# Patient Record
Sex: Male | Born: 1985 | Race: White | Hispanic: No | Marital: Married | Smoking: Never smoker
Health system: Southern US, Community
[De-identification: ages and names within clinical notes are randomized; demographics above are authoritative.]

## PROBLEM LIST (undated history)

## (undated) DIAGNOSIS — L4050 Arthropathic psoriasis, unspecified: Secondary | ICD-10-CM

## (undated) DIAGNOSIS — M109 Gout, unspecified: Secondary | ICD-10-CM

## (undated) HISTORY — DX: Arthropathic psoriasis, unspecified: L40.50

## (undated) HISTORY — DX: Gout, unspecified: M10.9

---

## 2015-12-06 ENCOUNTER — Ambulatory Visit (INDEPENDENT_AMBULATORY_CARE_PROVIDER_SITE_OTHER): Payer: BC Managed Care – PPO | Admitting: Rheumatology

## 2015-12-06 ENCOUNTER — Encounter: Payer: Self-pay | Admitting: Rheumatology

## 2015-12-06 DIAGNOSIS — L4050 Arthropathic psoriasis, unspecified: Secondary | ICD-10-CM

## 2015-12-06 DIAGNOSIS — Z79899 Other long term (current) drug therapy: Secondary | ICD-10-CM | POA: Diagnosis not present

## 2015-12-06 DIAGNOSIS — L409 Psoriasis, unspecified: Secondary | ICD-10-CM | POA: Diagnosis not present

## 2015-12-06 NOTE — Progress Notes (Signed)
*IMAGE* Office Visit Note  Patient: Colin Jensen             Date of Birth: 10/16/1985           MRN: [id number]             PCP: Orvilla CornwallHairfield, Keavie C Referring: No ref. provider found Visit Date: 12/06/2015 Occupation:PE Teacher    Subjective:  Left foot pain  History of Present Illness: Colin Jensen is a 30 y.o. male . Patient reports having a flare of psoriatic arthritis about 3 weeks ago. He missed 2 doses of Enbrel which triggered this flare. The symptoms have improved now. He reports few scattered lesions of psoriasis on his back and scalp. Patient reports having a sinus infection in August which was treated with antibiotics.  Activities of Daily Living:  Patient reports morning stiffness for 0 minutes.   Patient Denies nocturnal pain.  Difficulty dressing/grooming: Denies Difficulty climbing stairs: Denies Difficulty getting out of chair: Denies Difficulty using hands for taps, buttons, cutlery, and/or writing: Denies   Review of Systems  Constitutional: Negative for fatigue and weakness.  HENT: Negative for mouth sores and mouth dryness.   Eyes: Negative for dryness.  Respiratory: Negative for shortness of breath and difficulty breathing.   Cardiovascular: Negative for chest pain and hypertension.  Gastrointestinal: Negative for constipation and diarrhea.  Musculoskeletal: Positive for arthralgias and joint pain. Negative for joint swelling, myalgias, muscle weakness, morning stiffness, muscle tenderness and myalgias.  Skin: Positive for rash (Psoriasis). Negative for color change, hair loss, nodules/bumps, skin tightness and ulcers.  Allergic/Immunologic: Negative for susceptible to infections.  Neurological: Negative for dizziness and fainting.  Hematological: Negative for swollen glands.  Psychiatric/Behavioral: Negative for depressed mood and sleep disturbance. The patient is not nervous/anxious.     PMFS History:  Patient Active Problem List   Diagnosis Date  Noted  . Psoriatic arthritis (HCC) 12/06/2015  . Psoriasis 12/06/2015  . High risk medication use 12/06/2015    Past Medical History:  Diagnosis Date  . Psoriatic arthritis (HCC)     Family History  Problem Relation Age of Onset  . Heart attack Father   . Psoriasis Father   . Diabetes Sister    History reviewed. No pertinent surgical history. Social History   Social History Narrative  . No narrative on file     Objective: Vital Signs: BP 117/66 (BP Location: Left Arm, Patient Position: Sitting, Cuff Size: Large)   Pulse 65   Resp 12   Ht 5\' 11"  (1.803 m)   Wt 278 lb (126.1 kg)   BMI 38.77 kg/m    Physical Exam  Constitutional: He appears well-developed and well-nourished.  HENT:  Head: Normocephalic and atraumatic.  Eyes: Conjunctivae and EOM are normal. Pupils are equal, round, and reactive to light.  Neck: Normal range of motion. Neck supple.  Cardiovascular: Normal rate, regular rhythm and normal heart sounds.   Pulmonary/Chest: Effort normal and breath sounds normal.  Abdominal: Soft. Bowel sounds are normal.  Musculoskeletal: Normal range of motion.  Lymphadenopathy:    He has no cervical adenopathy.  Skin: Skin is warm and dry. Capillary refill takes less than 2 seconds. Rash (Psoriatic lesions on right hand, scalp) noted.  Nail pitting on bilateral hands   Psychiatric: He has a normal mood and affect. His behavior is normal.     Musculoskeletal Exam: C-spine and thoracic, thoracic spine, lumbar spine good range of motion with no SI joint tenderness. He good range of motion of  her shoulders, elbow joints, wrist joints. He had no tenderness or synovitis over his MCP joints,Left third PIP thickened but not tender., None of the DIP joints weren't inflamed. He had good range of motion of his hip joints, knee joints, ankle joints. MTPs PIPs DIPs of his feet showed no synovitis.  CDAI Exam: CDAI Homunculus Exam:   Swelling:  Left hand: 3rd PIP  Joint Counts:   CDAI Tender Joint count: 0 CDAI Swollen Joint count: 1  Global Assessments:  Patient Global Assessment: 2 Provider Global Assessment: 2  CDAI Calculated Score: 5    Investigation: Findings:  11/16/15: CBC normal, CMP normal  06/2014; TB negative,4/10 Hep neg     Imaging: No results found.  Speciality Comments: No specialty comments available.    Procedures:  No procedures performed Allergies: Ibuprofen and Penicillins   Assessment / Plan: Visit Diagnoses:  Psoriatic arthritis : Patient had recent flare of an Brohl which was in his left ankle. He had no swelling on ankle examination today. Besides nail pitting and some psoriasis patches I do not see any active synovitis in his joints. He has chronic thickening of his left third PIP joint. As there is still planning pregnancy he will continue with the Enbrel monotherapy.  Psoriasis: He has few scattered lesions of psoriasis on his extremities scalp and back.  High risk medication use: He is on an Enbrel weekly. His medication was recently refilled. I'll check his TB gold which has not been checked since May of last year.   Obesity: His BMI is 38.77. Weight loss diet and exercise was discussed.  Prevention: Association of heart disease with psoriatic arthritis was also emphasized. Need for regular exercise, been tending blood pressure, and cholesterol was discussed.   Orders:  Face-to-face time spent with patient was 30 minutes. 50% of time was spent in counseling and coordination of care.  Follow-Up Instructions: Return in about 5 months (around 05/05/2016) for Psoriatic arthritis.   Pollyann SavoyShaili Aarika Moon, MD

## 2015-12-06 NOTE — Patient Instructions (Signed)
Standing Labs We placed an order today for your standing lab work.    Please come back and get your standing labs in January 2018 and every 3 months.  We have open lab Monday through Friday from 8:30-11:30 AM and 1-4 PM at the office of Dr. Arbutus PedShaili Nyomie Ehrlich/Naitik Panwala, PA.   The office is located at [address], [address], [place], [place] [address] No appointment is necessary.   Labs are drawn by [facility].  You may receive a bill from [facility] for your lab work.     Supplements for OA Natural anti-inflammatories  You can purchase these at [employer], [employer] or online.  . Turmeric (capsules)  . Ginger (ginger root or capsules)  . Omega 3 (Fish, flax seeds, chia seeds, walnuts, almonds)  . Tart cherry (dried or extract)   Patient should be under the care of a physician while taking these supplements. This may not be reproduced without the permission of Dr. Pollyann SavoyShaili Ailana Cuadrado.

## 2015-12-09 LAB — QUANTIFERON TB GOLD ASSAY (BLOOD)
INTERFERON GAMMA RELEASE ASSAY: NEGATIVE
Mitogen-Nil: >10 IU/mL
QUANTIFERON NIL VALUE: 0.03 [IU]/mL
QUANTIFERON TB AG MINUS NIL: 0.01 [IU]/mL

## 2015-12-11 ENCOUNTER — Telehealth: Payer: Self-pay | Admitting: Radiology

## 2015-12-11 NOTE — Telephone Encounter (Signed)
I have called patient to advise labs are normal  

## 2015-12-11 NOTE — Telephone Encounter (Signed)
-----   Message from Pollyann SavoyShaili Deveshwar, MD sent at 12/11/2015  1:51 PM EST ----- TB gold negative

## 2016-02-06 ENCOUNTER — Other Ambulatory Visit: Payer: Self-pay | Admitting: Rheumatology

## 2016-02-07 NOTE — Telephone Encounter (Signed)
Okay 

## 2016-02-07 NOTE — Telephone Encounter (Signed)
Last Visit: 12/06/15 Next Visit: 05/07/16 Labs: 11/16/15 WNL Left message  Tb Gold: 12/06/15 Neg  Okay to refill Enbrel?

## 2016-05-01 DIAGNOSIS — Z8639 Personal history of other endocrine, nutritional and metabolic disease: Secondary | ICD-10-CM | POA: Insufficient documentation

## 2016-05-01 NOTE — Progress Notes (Signed)
Office Visit Note  Patient: Colin Jensen             Date of Birth: 12/10/1985           MRN: [id number]             PCP: Wendall PapaHairfield, Keavie C Referring: Wendall PapaHairfield, Keavie C, NP Visit Date: 05/07/2016 Occupation: @GUAROCC @    Subjective:  Hand pain   History of Present Illness: Colin Jensen is a 31 y.o. male with history of psoriasis and psoriatic arthritis. He states she's been having some stiffness in his hands  by doing some yardwork as the weather has warmed up. He denies increased joint swelling. He denies any psoriasis rash. His been tolerating Enbrel well.  Activities of Daily Living:  Patient reports morning stiffness for 0 minute.   Patient Denies nocturnal pain.  Difficulty dressing/grooming: Denies Difficulty climbing stairs: Denies Difficulty getting out of chair: Denies Difficulty using hands for taps, buttons, cutlery, and/or writing: Denies   Review of Systems  Constitutional: Negative for fatigue, night sweats and weakness ( ).  HENT: Negative for mouth sores, mouth dryness and nose dryness.   Eyes: Negative for redness and dryness.  Respiratory: Negative for shortness of breath and difficulty breathing.   Cardiovascular: Negative for chest pain, palpitations, hypertension, irregular heartbeat and swelling in legs/feet.  Gastrointestinal: Negative for constipation and diarrhea.  Endocrine: Negative for increased urination.  Musculoskeletal: Positive for arthralgias and joint pain. Negative for joint swelling, myalgias, muscle weakness, morning stiffness, muscle tenderness and myalgias.  Skin: Negative for color change, rash, hair loss, nodules/bumps, skin tightness, ulcers and sensitivity to sunlight.  Allergic/Immunologic: Negative for susceptible to infections.  Neurological: Negative for dizziness, fainting, memory loss and night sweats.  Hematological: Negative for swollen glands.  Psychiatric/Behavioral: Negative for depressed mood and sleep  disturbance. The patient is not nervous/anxious.     PMFS History:  Patient Active Problem List   Diagnosis Date Noted  . History of obesity 05/01/2016  . Psoriatic arthritis (HCC) 12/06/2015  . Psoriasis 12/06/2015  . High risk medication use 12/06/2015    Past Medical History:  Diagnosis Date  . Psoriatic arthritis (HCC)     Family History  Problem Relation Age of Onset  . Heart attack Father   . Psoriasis Father   . Diabetes Sister    No past surgical history on file. Social History   Social History Narrative  . No narrative on file     Objective: Vital Signs: BP 122/70   Pulse 80   Resp 14   Ht 5\' 11"  (1.803 m)   Wt 283 lb (128.4 kg)   BMI 39.47 kg/m    Physical Exam  Constitutional: He is oriented to person, place, and time. He appears well-developed and well-nourished.  HENT:  Head: Normocephalic and atraumatic.  Eyes: Conjunctivae and EOM are normal. Pupils are equal, round, and reactive to light.  Neck: Normal range of motion. Neck supple.  Cardiovascular: Normal rate, regular rhythm and normal heart sounds.   Pulmonary/Chest: Effort normal and breath sounds normal.  Abdominal: Soft. Bowel sounds are normal.  Neurological: He is alert and oriented to person, place, and time.  Skin: Skin is warm and dry. Capillary refill takes less than 2 seconds.  Psychiatric: He has a normal mood and affect. His behavior is normal.  Nursing note and vitals reviewed.    Musculoskeletal Exam: C-spine and thoracic lumbar spine good range of motion. No SI joint tenderness. Shoulder joints although joints wrist  joint MCPs PIPs DIPs with good range of motion. Hip joints knee joints ankles MTPs PIPs DIPs with good range of motion.  CDAI Exam: CDAI Homunculus Exam:   Joint Counts:  CDAI Tender Joint count: 0 CDAI Swollen Joint count: 0  Global Assessments:  Patient Global Assessment: 3 Provider Global Assessment: 2  CDAI Calculated Score:  5    Investigation: Findings:  11/16/15: CBC normal, CMP normal  06/2014; TB negative,4/10 Hep neg  TB Gold Negative-12/2015  Office Visit on 12/06/2015  Component Date Value Ref Range Status  . Interferon Gamma Release Assay 12/06/2015 NEGATIVE  NEGATIVE Final  . Quantiferon Nil Value 12/06/2015 0.03  IU/mL Final  . Mitogen-Nil 12/06/2015 >10.00  IU/mL Final  . Quantiferon Tb Ag Minus Nil Value 12/06/2015 0.01  IU/mL Final   Comment:   The Nil tube value is used to determine if the patient has a preexisting immune response which could cause a false-positive reading on the test. In order for a test to be valid, the Nil tube must have a value of less than or equal to 8.0 IU/mL.   The mitogen control tube is used to assure the patient has a healthy immune status and also serves as a control for correct blood handling and incubation. It is used to detect false-negative readings. The mitogen tube must have a gamma interferon value of greater than or equal to 0.5 IU/mL higher than the value of the Nil tube.   The TB antigen tube is coated with the M. tuberculosis specific antigens. For a test to be considered positive, the TB antigen tube value minus the Nil tube value must be greater than or equal to 0.35 IU/mL.   For additional information, please refer to http://education.[facility].com/faq/QFT (This link is being provided for informational/educational purposes only.)       Imaging: No results found.  Speciality Comments: No specialty comments available.    Procedures:  No procedures performed Allergies: Ibuprofen and Penicillins   Assessment / Plan:     Visit Diagnoses: Psoriatic arthritis (HCC): No active synovitis on examination today.He has occasional arthralgias with activities.  Psoriasis: No active lesions were noted today  High risk medication use - Enbrel - 50mg /ML injection  INJECT ONE SURECLICK PEN (50 MG) SUBCUTANEOUSLY ONCE A WEEK. We will  check labs today and then every 3 months to monitor for drug toxicity.  History of obesity    Orders: Orders Placed This Encounter  Procedures  . CBC with Differential/Platelet  . COMPLETE METABOLIC PANEL WITH GFR  . CBC with Differential/Platelet  . COMPLETE METABOLIC PANEL WITH GFR   No orders of the defined types were placed in this encounter. Association of heart disease with psoriatic arthritis was discussed. Need to monitor blood pressure, cholesterol, and to exercise 30-60 minutes on daily basis was discussed.   Face-to-face time spent with patient was 30 minutes. 50% of time was spent in counseling and coordination of care.  Follow-Up Instructions: Return in about 5 months (around 10/07/2016) for Psoriatic arthritis.   Pollyann Savoy, MD  Note - This record has been created using Animal nutritionist.  Chart creation errors have been sought, but may not always  have been located. Such creation errors do not reflect on  the standard of medical care.

## 2016-05-07 ENCOUNTER — Ambulatory Visit (INDEPENDENT_AMBULATORY_CARE_PROVIDER_SITE_OTHER): Payer: BC Managed Care – PPO | Admitting: Rheumatology

## 2016-05-07 ENCOUNTER — Encounter: Payer: Self-pay | Admitting: Rheumatology

## 2016-05-07 VITALS — BP 122/70 | HR 80 | Resp 14 | Ht 71.0 in | Wt 283.0 lb

## 2016-05-07 DIAGNOSIS — Z79899 Other long term (current) drug therapy: Secondary | ICD-10-CM | POA: Diagnosis not present

## 2016-05-07 DIAGNOSIS — L409 Psoriasis, unspecified: Secondary | ICD-10-CM | POA: Diagnosis not present

## 2016-05-07 DIAGNOSIS — Z8639 Personal history of other endocrine, nutritional and metabolic disease: Secondary | ICD-10-CM

## 2016-05-07 DIAGNOSIS — L4050 Arthropathic psoriasis, unspecified: Secondary | ICD-10-CM

## 2016-05-07 LAB — COMPLETE METABOLIC PANEL WITH GFR
ALBUMIN: 4.2 g/dL (ref 3.6–5.1)
ALK PHOS: 46 U/L (ref 40–115)
ALT: 25 U/L (ref 9–46)
AST: 24 U/L (ref 10–40)
BUN: 14 mg/dL (ref 7–25)
CALCIUM: 9.1 mg/dL (ref 8.6–10.3)
CO2: 27 mmol/L (ref 20–31)
CREATININE: 0.86 mg/dL (ref 0.60–1.35)
Chloride: 103 mmol/L (ref 98–110)
GFR, Est African American: >89 mL/min (ref >=60)
GFR, Est Non African American: >89 mL/min (ref >=60)
Glucose, Bld: 106 mg/dL — ABNORMAL HIGH (ref 65–99)
Potassium: 4.4 mmol/L (ref 3.5–5.3)
Sodium: 141 mmol/L (ref 135–146)
TOTAL PROTEIN: 7 g/dL (ref 6.1–8.1)
Total Bilirubin: 0.9 mg/dL (ref 0.2–1.2)

## 2016-05-07 LAB — CBC WITH DIFFERENTIAL/PLATELET
BASOS PCT: 1 %
Basophils Absolute: 52 cells/uL (ref 0–200)
Eosinophils Absolute: 104 cells/uL (ref 15–500)
Eosinophils Relative: 2 %
HEMATOCRIT: 43.6 % (ref 38.5–50.0)
HEMOGLOBIN: 14.8 g/dL (ref 13.2–17.1)
LYMPHS ABS: 1768 {cells}/uL (ref 850–3900)
Lymphocytes Relative: 34 %
MCH: 31.3 pg (ref 27.0–33.0)
MCHC: 33.9 g/dL (ref 32.0–36.0)
MCV: 92.2 fL (ref 80.0–100.0)
MONO ABS: 520 {cells}/uL (ref 200–950)
MPV: 10.4 fL (ref 7.5–12.5)
Monocytes Relative: 10 %
Neutro Abs: 2756 cells/uL (ref 1500–7800)
Neutrophils Relative %: 53 %
Platelets: 182 10*3/uL (ref 140–400)
RBC: 4.73 MIL/uL (ref 4.20–5.80)
RDW: 13.3 % (ref 11.0–15.0)
WBC: 5.2 10*3/uL (ref 3.8–10.8)

## 2016-05-07 NOTE — Progress Notes (Addendum)
Rheumatology Medication Review by a Pharmacist Does the patient feel that his/her medications are working for him/her?  Yes Has the patient been experiencing any side effects to the medications prescribed?  No Does the patient have any problems obtaining medications?  No  Issues to address at subsequent visits: None   Pharmacist comments:  Colin Jensen is a pleasant 31 yo M who presents for follow up of his psoriatic arthritis.  He is currently taking Enbrel once weekly.  Most recent standing labs were 11/16/15.  Patient is due for standing labs today.  Most recent TB Gold was negative on 12/06/2015.  He will be due for TB Gold again in November 2018.  Patient denies any questions or concerns regarding his medications at this time.    Lilla Shook, Pharm.D., BCPS, CPP Clinical Pharmacist Pager: [phone] Phone: [phone] 05/07/2016 9:03 AM

## 2016-05-07 NOTE — Patient Instructions (Signed)
Standing Labs We placed an order today for your standing lab work.    Please come back and get your standing labs in July and every 3 months.  We have open lab Monday through Friday from 8:30-11:30 AM and 1:30-4 PM at the office of Dr. Nicol Herbig/Naitik Panwala, PA.   The office is located at [address], [address], [place], [place] [address] No appointment is necessary.   Labs are drawn by [facility].  You may receive a bill from [facility] for your lab work.    

## 2016-05-08 NOTE — Progress Notes (Signed)
WNL

## 2016-06-20 ENCOUNTER — Other Ambulatory Visit: Payer: Self-pay | Admitting: *Deleted

## 2016-06-20 MED ORDER — ETANERCEPT 50 MG/ML ~~LOC~~ SOAJ: SUBCUTANEOUS | 0 refills | Status: DC

## 2016-06-20 NOTE — Telephone Encounter (Signed)
Refill request received via fax  Last Visit: 05/07/16 Next Visit: 10/08/16 Labs: 05/07/16 WNL TB Gold: 12/06/15 Neg  Oka to refill per Dr. Corliss Skainseveshwar

## 2016-07-18 ENCOUNTER — Telehealth: Payer: Self-pay | Admitting: Pharmacist

## 2016-07-18 NOTE — Telephone Encounter (Signed)
Received fax from [facility] (phone number [phone]) stating they were having trouble reaching patient to process their next medication refill order.  I called patient to discuss.  There was no answer and voicemail was full.  Will attempt to call again later.    Lilla Shookachel Arlow Spiers, Pharm.D., BCPS, CPP Clinical Pharmacist Pager: [phone] Phone: [phone] 07/18/2016 4:41 PM

## 2016-07-22 ENCOUNTER — Telehealth: Payer: Self-pay | Admitting: Rheumatology

## 2016-07-22 NOTE — Telephone Encounter (Signed)
I spoke to patient regarding Enbrel.  He confirms he received a voicemail from [facility] regarding his refill, but he had not returned the call because he still has 3 pens remaining.  Patient denies any problems with medication adverse events or any concerns about medication cost.  He confirms he is taking Enbrel 50 mg weekly.  I advised patient to contact [facility] to refill his prescription when he gets down to his last pen.  Patient voiced understanding.    Lilla Shookachel Marcile Fuquay, Pharm.D., BCPS, CPP Clinical Pharmacist Pager: [phone] Phone: [phone] 07/22/2016 3:57 PM

## 2016-07-22 NOTE — Telephone Encounter (Signed)
See previous telephone note. 

## 2016-07-22 NOTE — Telephone Encounter (Signed)
Patient returned Colin Jensen's call.  CB#[phone].  Thank you.

## 2016-10-01 NOTE — Progress Notes (Signed)
Office Visit Note  Patient: Colin Jensen             Date of Birth: May 19, 1985           MRN: [id number]             PCP: Wendall Papa Referring: Wendall Papa, NP Visit Date: 10/08/2016 Occupation: @GUAROCC @    Subjective:  Psoriasis patches.   History of Present Illness: Colin Jensen is a 31 y.o. male with history of psoriatic arthritis and psoriasis. He states he is doing quite well on Enbrel once a week. He has had no side effects from the medication. He denies any joint pain or joint swelling. He has no morning stiffness. He has few psoriasis patches which were originally presented and also he has few lesions in his beard area and scalp. Patient states if he forgets is an Brohl or delays the dose then he started having joint pain and stiffness. He denies any recent episodes of Achilles tendinitis, dactylitis or plantar fasciitis. He denies any SI joint pain.  Activities of Daily Living:  Patient reports morning stiffness for 0 minute.   Patient Denies nocturnal pain.  Difficulty dressing/grooming: Denies Difficulty climbing stairs: Denies Difficulty getting out of chair: Denies Difficulty using hands for taps, buttons, cutlery, and/or writing: Denies   Review of Systems  Constitutional: Negative for fatigue, night sweats and weakness ( ).  HENT: Negative for mouth sores, mouth dryness and nose dryness.   Eyes: Negative for redness and dryness.  Respiratory: Negative for shortness of breath and difficulty breathing.   Cardiovascular: Negative for chest pain, palpitations, hypertension, irregular heartbeat and swelling in legs/feet.  Gastrointestinal: Negative for constipation and diarrhea.  Endocrine: Negative for increased urination.  Musculoskeletal: Negative for arthralgias, joint pain, joint swelling, myalgias, muscle weakness, morning stiffness, muscle tenderness and myalgias.  Skin: Positive for rash. Negative for color change, hair loss, nodules/bumps,  skin tightness, ulcers and sensitivity to sunlight.       Psoriasis patches in scalp and beard area  Allergic/Immunologic: Negative for susceptible to infections.  Neurological: Negative for dizziness, fainting, memory loss and night sweats.  Hematological: Negative for swollen glands.  Psychiatric/Behavioral: Negative for depressed mood and sleep disturbance. The patient is not nervous/anxious.     PMFS History:  Patient Active Problem List   Diagnosis Date Noted  . History of obesity 05/01/2016  . Psoriatic arthritis (HCC) 12/06/2015  . Psoriasis 12/06/2015  . High risk medication use 12/06/2015    Past Medical History:  Diagnosis Date  . Psoriatic arthritis (HCC)     Family History  Problem Relation Age of Onset  . Heart attack Father   . Psoriasis Father   . Diabetes Sister    History reviewed. No pertinent surgical history. Social History   Social History Narrative  . No narrative on file     Objective: Vital Signs: BP 130/72   Pulse 74   Resp 14   Ht 5\' 11"  (1.803 m)   Wt 270 lb (122.5 kg)   BMI 37.66 kg/m    Physical Exam  Constitutional: He is oriented to person, place, and time. He appears well-developed and well-nourished.  HENT:  Head: Normocephalic and atraumatic.  Eyes: Pupils are equal, round, and reactive to light. Conjunctivae and EOM are normal.  Neck: Normal range of motion. Neck supple.  Cardiovascular: Normal rate, regular rhythm and normal heart sounds.   Pulmonary/Chest: Effort normal and breath sounds normal.  Abdominal: Soft. Bowel sounds are  normal.  Neurological: He is alert and oriented to person, place, and time.  Skin: Skin is warm and dry. Capillary refill takes less than 2 seconds.  Dry skin and some psoriasis noted on the scalp and beard area he also has dry skin over bilateral elbows. He had nail pitting and  nail dystrophy  Psychiatric: He has a normal mood and affect. His behavior is normal.  Nursing note and vitals  reviewed.    Musculoskeletal Exam: C-spine and thoracic lumbar spine good range of motion. No SI joint tenderness. Shoulder joints elbow joints wrist joint MCPs PIPs DIPs are good range of motion with no synovitis. Hip joints knee joints ankles MTPs PIPs DIPs are good range of motion with no synovitis.  CDAI Exam: CDAI Homunculus Exam:   Joint Counts:  CDAI Tender Joint count: 0 CDAI Swollen Joint count: 0  Global Assessments:  Patient Global Assessment: 2 Provider Global Assessment: 1  CDAI Calculated Score: 3    Investigation: Findings:  12/06/2015 negative TB gold   CBC Latest Ref Rng & Units 05/07/2016  WBC 3.8 - 10.8 K/uL 5.2  Hemoglobin 13.2 - 17.1 g/dL 16.1  Hematocrit 09.6 - 50.0 % 43.6  Platelets 140 - 400 K/uL 182   CMP Latest Ref Rng & Units 05/07/2016  Glucose 65 - 99 mg/dL 045(W)  BUN 7 - 25 mg/dL 14  Creatinine 0.98 - 1.19 mg/dL 1.47  Sodium 829 - 562 mmol/L 141  Potassium 3.5 - 5.3 mmol/L 4.4  Chloride 98 - 110 mmol/L 103  CO2 20 - 31 mmol/L 27  Calcium 8.6 - 10.3 mg/dL 9.1  Total Protein 6.1 - 8.1 g/dL 7.0  Total Bilirubin 0.2 - 1.2 mg/dL 0.9  Alkaline Phos 40 - 115 U/L 46  AST 10 - 40 U/L 24  ALT 9 - 46 U/L 25    Imaging: No results found.  Speciality Comments: No specialty comments available.    Procedures:  No procedures performed Allergies: Ibuprofen and Penicillins   Assessment / Plan:     Visit Diagnoses: Psoriatic arthritis (HCC): He does not have any synovitis or joint tenderness on examination. He denies any flares of psoriatic arthritis. He states he does experience some arthralgias with delay office Enbrel injection.   Psoriasis: He still have some psoriasis patches as described above and nail pitting. I'll call in prescription for clobetasol foam to be applied to the area twice a day. Side effects were discussed.  High risk medication use - Enbrel 50 mg sq q week - Plan: CBC with Differential/Platelet, today and every 3 months to  monitor for drug toxicity COMPLETE METABOLIC PANEL WITH GFR, Quantiferon tb gold assay (blood) on a yearly basis due to long-term immunosuppressive therapy   Encounter for screening for lipid disorder - Plan: Lipid panel  Obesity (BMI 35.0-39.9 without comorbidity) : Weight loss diet and exercise was discussed at length. Patient has modified his diet to some extent. He reports  intentional weight loss.  Association of heart disease with psoriatic arthritis was discussed. Need to monitor blood pressure, cholesterol, and to exercise 30-60 minutes on daily basis was discussed.   Orders: Orders Placed This Encounter  Procedures  . Quantiferon tb gold assay (blood)  . Lipid panel   Meds ordered this encounter  Medications  . clobetasol (OLUX) 0.05 % topical foam    Sig: Apply topically 2 (two) times daily. For one month    Dispense:  100 g    Refill:  1  Follow-Up Instructions: Return in about 5 months (around 03/10/2017) for Psoriatic arthritis Ps.   Pollyann Savoy, MD  Note - This record has been created using Animal nutritionist.  Chart creation errors have been sought, but may not always  have been located. Such creation errors do not reflect on  the standard of medical care.

## 2016-10-08 ENCOUNTER — Encounter (INDEPENDENT_AMBULATORY_CARE_PROVIDER_SITE_OTHER): Payer: Self-pay

## 2016-10-08 ENCOUNTER — Encounter: Payer: Self-pay | Admitting: Rheumatology

## 2016-10-08 ENCOUNTER — Ambulatory Visit (INDEPENDENT_AMBULATORY_CARE_PROVIDER_SITE_OTHER): Payer: BC Managed Care – PPO | Admitting: Rheumatology

## 2016-10-08 VITALS — BP 130/72 | HR 74 | Resp 14 | Ht 71.0 in | Wt 270.0 lb

## 2016-10-08 DIAGNOSIS — E669 Obesity, unspecified: Secondary | ICD-10-CM | POA: Diagnosis not present

## 2016-10-08 DIAGNOSIS — L409 Psoriasis, unspecified: Secondary | ICD-10-CM | POA: Diagnosis not present

## 2016-10-08 DIAGNOSIS — Z13220 Encounter for screening for lipoid disorders: Secondary | ICD-10-CM

## 2016-10-08 DIAGNOSIS — L4050 Arthropathic psoriasis, unspecified: Secondary | ICD-10-CM

## 2016-10-08 DIAGNOSIS — Z79899 Other long term (current) drug therapy: Secondary | ICD-10-CM | POA: Diagnosis not present

## 2016-10-08 MED ORDER — CLOBETASOL PROPIONATE 0.05 % EX FOAM: Freq: Two times a day (BID) | CUTANEOUS | 1 refills | Status: DC

## 2016-10-08 NOTE — Patient Instructions (Signed)
Standing Labs We placed an order today for your standing lab work.    Please come back and get your standing labs in December and every 3 months  We have open lab Monday through Friday from 8:30-11:30 AM and 1:30-4 PM at the office of Dr. Guido Comp.   The office is located at [address], [address], [place], [place] [address] No appointment is necessary.   Labs are drawn by [facility].  You may receive a bill from [facility] for your lab work. If you have any questions regarding directions or hours of operation,  please call [phone].    

## 2016-10-09 LAB — LIPID PANEL
CHOLESTEROL: 140 mg/dL (ref <200)
HDL: 40 mg/dL — AB (ref >40)
LDL CALC: 82 mg/dL (ref <100)
TRIGLYCERIDES: 91 mg/dL (ref <150)
Total CHOL/HDL Ratio: 3.5 Ratio (ref <5.0)
VLDL: 18 mg/dL (ref <30)

## 2016-10-09 LAB — QUANTIFERON TB GOLD ASSAY (BLOOD)
Interferon Gamma Release Assay: NEGATIVE
QUANTIFERON NIL VALUE: 0.06 [IU]/mL

## 2016-10-09 NOTE — Progress Notes (Signed)
Low HDL. Advise exercise.

## 2016-10-09 NOTE — Progress Notes (Signed)
TB gold neg

## 2016-10-10 ENCOUNTER — Telehealth: Payer: Self-pay | Admitting: Rheumatology

## 2016-10-10 NOTE — Telephone Encounter (Signed)
Patient advised of lab results and verbalized understanding.  

## 2016-10-10 NOTE — Telephone Encounter (Signed)
Patient left a message at 5pm 10/09/16 stating he was returning Andrea's call.

## 2016-10-24 ENCOUNTER — Other Ambulatory Visit: Payer: Self-pay | Admitting: Rheumatology

## 2016-10-24 DIAGNOSIS — Z79899 Other long term (current) drug therapy: Secondary | ICD-10-CM

## 2016-10-24 NOTE — Telephone Encounter (Addendum)
Last Visit: 10/08/16 Next Visit: 03/10/17 Labs: 05/07/16 WNL Patient will update CBC/ CMP Friday or Monday TB Gold: 10/08/16 Neg  Left message to remind patient due for labs.  Okay to refill 30 day supply Enbrel?

## 2016-11-01 NOTE — Telephone Encounter (Signed)
ok 

## 2016-11-14 ENCOUNTER — Telehealth: Payer: Self-pay | Admitting: Rheumatology

## 2016-11-14 LAB — COMPLETE METABOLIC PANEL WITH GFR
AG RATIO: 1.6 (calc) (ref 1.0–2.5)
ALT: 18 U/L (ref 9–46)
AST: 18 U/L (ref 10–40)
Albumin: 4.6 g/dL (ref 3.6–5.1)
Alkaline phosphatase (APISO): 48 U/L (ref 40–115)
BUN: 15 mg/dL (ref 7–25)
CALCIUM: 9.3 mg/dL (ref 8.6–10.3)
CO2: 31 mmol/L (ref 20–32)
CREATININE: 0.72 mg/dL (ref 0.60–1.35)
Chloride: 104 mmol/L (ref 98–110)
GFR, EST NON AFRICAN AMERICAN: 123 mL/min/{1.73_m2} (ref >=60)
GFR, Est African American: 142 mL/min/{1.73_m2} (ref >=60)
GLOBULIN: 2.8 g/dL (ref 1.9–3.7)
Glucose, Bld: 92 mg/dL (ref 65–99)
Potassium: 4.7 mmol/L (ref 3.5–5.3)
Sodium: 140 mmol/L (ref 135–146)
TOTAL PROTEIN: 7.4 g/dL (ref 6.1–8.1)
Total Bilirubin: 0.9 mg/dL (ref 0.2–1.2)

## 2016-11-14 LAB — CBC WITH DIFFERENTIAL/PLATELET
Basophils Absolute: 62 cells/uL (ref 0–200)
Basophils Relative: 1.4 %
Eosinophils Absolute: 101 cells/uL (ref 15–500)
Eosinophils Relative: 2.3 %
HEMATOCRIT: 43 % (ref 38.5–50.0)
Hemoglobin: 14.8 g/dL (ref 13.2–17.1)
LYMPHS ABS: 1456 {cells}/uL (ref 850–3900)
MCH: 31 pg (ref 27.0–33.0)
MCHC: 34.4 g/dL (ref 32.0–36.0)
MCV: 90 fL (ref 80.0–100.0)
MPV: 10.5 fL (ref 7.5–12.5)
Monocytes Relative: 10.6 %
NEUTROS PCT: 52.6 %
Neutro Abs: 2314 cells/uL (ref 1500–7800)
Platelets: 186 10*3/uL (ref 140–400)
RBC: 4.78 10*6/uL (ref 4.20–5.80)
RDW: 12.7 % (ref 11.0–15.0)
Total Lymphocyte: 33.1 %
WBC: 4.4 10*3/uL (ref 3.8–10.8)
WBCMIX: 466 {cells}/uL (ref 200–950)

## 2016-11-14 NOTE — Telephone Encounter (Signed)
WNL

## 2016-11-14 NOTE — Telephone Encounter (Signed)
Patient called and states he had labs drawn this morning at [facility] in [place]. He wanted to let us know so he could get his prescription refilled.

## 2016-11-14 NOTE — Telephone Encounter (Signed)
Patient advised a 30 day supply was sent into the pharmacy on 11/01/16. Will send 90 supply with next refill. Patient to contact pharmacy.

## 2016-12-05 ENCOUNTER — Ambulatory Visit: Payer: Self-pay | Admitting: Rheumatology

## 2017-01-10 ENCOUNTER — Other Ambulatory Visit: Payer: Self-pay | Admitting: Rheumatology

## 2017-01-10 NOTE — Telephone Encounter (Signed)
Last Visit: 10/08/16 Next Visit: 03/10/17 Labs: 11/14/16 WNL TB Gold: 11/14/16 Neg   Okay to refill per Dr. Corliss Skainseveshwar

## 2017-03-10 ENCOUNTER — Ambulatory Visit: Payer: BC Managed Care – PPO | Admitting: Rheumatology

## 2017-03-26 NOTE — Progress Notes (Deleted)
   Office Visit Note  Patient: Colin Jensen             Date of Birth: 09/25/1985           MRN: [id number]             PCP: Wendall PapaHairfield, Keavie C Referring: Wendall PapaHairfield, Keavie C, NP Visit Date: 04/09/2017 Occupation: @GUAROCC @    Subjective:  No chief complaint on file.   History of Present Illness: Colin Jensen is a 32 y.o. male ***   Activities of Daily Living:  Patient reports morning stiffness for *** {minute/hour:19697}.   Patient {ACTIONS;DENIES/REPORTS:21021675::"Denies"} nocturnal pain.  Difficulty dressing/grooming: {ACTIONS;DENIES/REPORTS:21021675::"Denies"} Difficulty climbing stairs: {ACTIONS;DENIES/REPORTS:21021675::"Denies"} Difficulty getting out of chair: {ACTIONS;DENIES/REPORTS:21021675::"Denies"} Difficulty using hands for taps, buttons, cutlery, and/or writing: {ACTIONS;DENIES/REPORTS:21021675::"Denies"}   No Rheumatology ROS completed.   PMFS History:  Patient Active Problem List   Diagnosis Date Noted  . History of obesity 05/01/2016  . Psoriatic arthritis (HCC) 12/06/2015  . Psoriasis 12/06/2015  . High risk medication use 12/06/2015    Past Medical History:  Diagnosis Date  . Psoriatic arthritis (HCC)     Family History  Problem Relation Age of Onset  . Heart attack Father   . Psoriasis Father   . Diabetes Sister    No past surgical history on file. Social History   Social History Narrative  . Not on file     Objective: Vital Signs: There were no vitals taken for this visit.   Physical Exam   Musculoskeletal Exam: ***  CDAI Exam: No CDAI exam completed.    Investigation: No additional findings. CBC Latest Ref Rng & Units 11/14/2016 05/07/2016  WBC 3.8 - 10.8 Thousand/uL 4.4 5.2  Hemoglobin 13.2 - 17.1 g/dL 40.914.8 81.114.8  Hematocrit 91.438.5 - 50.0 % 43.0 43.6  Platelets 140 - 400 Thousand/uL 186 182   CMP Latest Ref Rng & Units 11/14/2016 05/07/2016  Glucose 65 - 99 mg/dL 92 782(N106(H)  BUN 7 - 25 mg/dL 15 14  Creatinine 5.620.60 - 1.35  mg/dL 1.300.72 8.650.86  Sodium 784135 - 146 mmol/L 140 141  Potassium 3.5 - 5.3 mmol/L 4.7 4.4  Chloride 98 - 110 mmol/L 104 103  CO2 20 - 32 mmol/L 31 27  Calcium 8.6 - 10.3 mg/dL 9.3 9.1  Total Protein 6.1 - 8.1 g/dL 7.4 7.0  Total Bilirubin 0.2 - 1.2 mg/dL 0.9 0.9  Alkaline Phos 40 - 115 U/L - 46  AST 10 - 40 U/L 18 24  ALT 9 - 46 U/L 18 25    Imaging: No results found.  Speciality Comments: No specialty comments available.    Procedures:  No procedures performed Allergies: Ibuprofen and Penicillins   Assessment / Plan:     Visit Diagnoses: Psoriatic arthritis (HCC)  Psoriasis - Clobetasol 0.05% topical foam  High risk medication use - Enbrel sureclick  History of obesity    Orders: No orders of the defined types were placed in this encounter.  No orders of the defined types were placed in this encounter.   Face-to-face time spent with patient was *** minutes. 50% of time was spent in counseling and coordination of care.  Follow-Up Instructions: No Follow-up on file.   Gearldine Bienenstockaylor M Dale, PA-C  Note - This record has been created using Dragon software.  Chart creation errors have been sought, but may not always  have been located. Such creation errors do not reflect on  the standard of medical care.

## 2017-04-09 ENCOUNTER — Ambulatory Visit: Payer: BC Managed Care – PPO | Admitting: Physician Assistant

## 2017-04-25 ENCOUNTER — Other Ambulatory Visit: Payer: Self-pay | Admitting: Rheumatology

## 2017-04-25 NOTE — Telephone Encounter (Addendum)
Last Visit: 10/08/16 Next Visit: 03/10/17 Labs: 11/14/16 WNL TB Gold: 11/14/16 Neg   Patient advised he is due to update labs. Patient will update this week.   Okay to refill 30 day supply per Dr. Corliss Skainseveshwar

## 2017-04-28 ENCOUNTER — Other Ambulatory Visit: Payer: Self-pay | Admitting: *Deleted

## 2017-04-28 DIAGNOSIS — Z79899 Other long term (current) drug therapy: Secondary | ICD-10-CM

## 2017-04-28 NOTE — Addendum Note (Signed)
Addended by: Henriette CombsHATTON, Keymani Mclean L on: 04/28/2017 09:47 AM   Modules accepted: Orders

## 2017-04-28 NOTE — Addendum Note (Signed)
Addended by: Henriette CombsHATTON, Desera Graffeo L on: 04/28/2017 09:46 AM   Modules accepted: Orders

## 2017-07-04 LAB — COMPLETE METABOLIC PANEL WITH GFR
AG Ratio: 1.6 (calc) (ref 1.0–2.5)
ALKALINE PHOSPHATASE (APISO): 58 U/L (ref 40–115)
ALT: 17 U/L (ref 9–46)
AST: 19 U/L (ref 10–40)
Albumin: 4.5 g/dL (ref 3.6–5.1)
BUN: 13 mg/dL (ref 7–25)
CALCIUM: 9.2 mg/dL (ref 8.6–10.3)
CO2: 28 mmol/L (ref 20–32)
CREATININE: 0.82 mg/dL (ref 0.60–1.35)
Chloride: 104 mmol/L (ref 98–110)
GFR, EST NON AFRICAN AMERICAN: 116 mL/min/{1.73_m2} (ref >=60)
GFR, Est African American: 135 mL/min/{1.73_m2} (ref >=60)
GLOBULIN: 2.8 g/dL (ref 1.9–3.7)
Glucose, Bld: 104 mg/dL (ref 65–139)
POTASSIUM: 4.1 mmol/L (ref 3.5–5.3)
SODIUM: 141 mmol/L (ref 135–146)
Total Bilirubin: 0.6 mg/dL (ref 0.2–1.2)
Total Protein: 7.3 g/dL (ref 6.1–8.1)

## 2017-07-04 LAB — CBC WITH DIFFERENTIAL/PLATELET
BASOS PCT: 1.1 %
Basophils Absolute: 61 cells/uL (ref 0–200)
Eosinophils Absolute: 143 cells/uL (ref 15–500)
Eosinophils Relative: 2.6 %
HCT: 41.3 % (ref 38.5–50.0)
Hemoglobin: 14.5 g/dL (ref 13.2–17.1)
Lymphs Abs: 1595 cells/uL (ref 850–3900)
MCH: 30.9 pg (ref 27.0–33.0)
MCHC: 35.1 g/dL (ref 32.0–36.0)
MCV: 87.9 fL (ref 80.0–100.0)
MONOS PCT: 10 %
MPV: 10.5 fL (ref 7.5–12.5)
Neutro Abs: 3152 cells/uL (ref 1500–7800)
Neutrophils Relative %: 57.3 %
PLATELETS: 201 10*3/uL (ref 140–400)
RBC: 4.7 10*6/uL (ref 4.20–5.80)
RDW: 12.6 % (ref 11.0–15.0)
TOTAL LYMPHOCYTE: 29 %
WBC mixed population: 550 cells/uL (ref 200–950)
WBC: 5.5 10*3/uL (ref 3.8–10.8)

## 2017-07-04 NOTE — Progress Notes (Signed)
NOTIFIED PT OF LAB RESULTS.  WOULD LIKE REFILL ON ENBREL, PLEASE ADVISE

## 2017-07-07 ENCOUNTER — Telehealth: Payer: Self-pay | Admitting: *Deleted

## 2017-07-07 NOTE — Telephone Encounter (Signed)
-----   Message from Reine JustMaria R Villareal, RT sent at 07/04/2017 10:49 AM EDT ----- PT WOULD LIKE ENBREL REFILLED, PLEASE ADVISE. NOTIFIED PT OF LAB RESULTS ----- Message ----- From: Henriette CombsHatton, Tonantzin Mimnaugh L, LPN Sent: 0/[id number]/11915/31/2019   8:20 AM To: Reine JustMaria R Villareal, RT

## 2017-07-08 ENCOUNTER — Telehealth: Payer: Self-pay | Admitting: Rheumatology

## 2017-07-08 NOTE — Telephone Encounter (Signed)
Patient left a voicemail stating he was returning your call. °

## 2017-07-08 NOTE — Telephone Encounter (Addendum)
Attempted to contact patient and unable to leave a message for patient. Mailbox is full.

## 2017-07-08 NOTE — Telephone Encounter (Signed)
Attempted to contact the patient and left message for patient to call the office. Patient needs an appointment before refill.

## 2017-07-14 ENCOUNTER — Telehealth: Payer: Self-pay | Admitting: Rheumatology

## 2017-07-14 NOTE — Telephone Encounter (Signed)
Patient advised he will need to have an appointment before prescription can be refilled. Patient has been scheduled for 07/15/17 at 9:40 am.

## 2017-07-14 NOTE — Progress Notes (Signed)
Office Visit Note  Patient: Colin Jensen             Date of Birth: August 31, 1985           MRN: [id number]             PCP: Wendall Papa Referring: Wendall Papa, NP Visit Date: 07/15/2017 Occupation: @GUAROCC @    Subjective:  Left hip pain   History of Present Illness: [name] is a 32 y.o. male with history of psoriatic arthritis.  Patient is on Enbrel 50 mg subcutaneous injections once weekly.  He has been tolerating Enbrel and has not missed any doses.  He denies any plantar fasciitis or Achilles tendinitis.  Denies any SI joint pain.  He has been having discomfort in his left hip for about 1-2 months.  He states that he was playing golf and later that evening he started having pain.  He reports he has good range of motion of his left hip joint.  He reports symptoms of left-sided sciatica.  He reports that he size PCP who ordered an abdominal CT and was worried about diverticulitis.  Apparently CT did not really reveal any abnormal findings.  No recommendations or referrals were made for the left hip pain.  He states he has been seeing a chiropractor occasionally which has been helping with the pain.  He denies any right hip or knee pain.  He denies any other joint pain or joint swelling at this time.  He states he has been trying to stay active.  He states that he his psoriasis has been stable he continues to have a few patches on his bilateral elbows bilateral knees and torso.   Activities of Daily Living:  Patient reports morning stiffness for 10-15  minutes.   Patient Denies nocturnal pain.  Difficulty dressing/grooming: Denies Difficulty climbing stairs: Denies Difficulty getting out of chair: Denies Difficulty using hands for taps, buttons, cutlery, and/or writing: Denies   Review of Systems  Constitutional: Negative for fatigue, fever and night sweats.  HENT: Negative for ear pain, mouth sores, mouth dryness and nose dryness.   Eyes: Negative for pain,  redness and dryness.  Respiratory: Negative for cough, shortness of breath and difficulty breathing.   Cardiovascular: Negative for chest pain, palpitations, hypertension, irregular heartbeat and swelling in legs/feet.  Gastrointestinal: Negative for blood in stool, constipation and diarrhea.  Endocrine: Negative for increased urination.  Genitourinary: Negative for difficulty urinating and painful urination.  Musculoskeletal: Positive for arthralgias, joint pain, muscle weakness and morning stiffness. Negative for joint swelling, myalgias, muscle tenderness and myalgias.  Skin: Negative for color change, rash, hair loss, nodules/bumps, skin tightness, ulcers and sensitivity to sunlight.  Allergic/Immunologic: Negative for susceptible to infections.  Neurological: Negative for dizziness, fainting, numbness, memory loss and night sweats.  Hematological: Negative for bruising/bleeding tendency and swollen glands.  Psychiatric/Behavioral: Negative for depressed mood and sleep disturbance. The patient is not nervous/anxious.     PMFS History:  Patient Active Problem List   Diagnosis Date Noted  . History of obesity 05/01/2016  . Psoriatic arthritis (HCC) 12/06/2015  . Psoriasis 12/06/2015  . High risk medication use 12/06/2015    Past Medical History:  Diagnosis Date  . Psoriatic arthritis (HCC)     Family History  Problem Relation Age of Onset  . Heart attack Father   . Psoriasis Father   . Diabetes Sister    History reviewed. No pertinent surgical history. Social History   Social History Narrative  .  Not on file     Objective: Vital Signs: BP 106/72 (BP Location: Left Arm, Patient Position: Sitting, Cuff Size: Normal)   Pulse 68   Ht 5\' 11"  (1.803 m)   Wt 258 lb (117 kg)   BMI 35.98 kg/m    Physical Exam  Constitutional: He is oriented to person, place, and time. He appears well-developed and well-nourished.  HENT:  Head: Normocephalic and atraumatic.  Eyes: Pupils  are equal, round, and reactive to light. Conjunctivae and EOM are normal.  Neck: Normal range of motion. Neck supple.  Cardiovascular: Normal rate, regular rhythm and normal heart sounds.  Pulmonary/Chest: Effort normal and breath sounds normal.  Abdominal: Soft. Bowel sounds are normal.  Lymphadenopathy:    He has no cervical adenopathy.  Neurological: He is alert and oriented to person, place, and time.  Skin: Skin is warm and dry. Capillary refill takes less than 2 seconds.  Psychiatric: He has a normal mood and affect. His behavior is normal.  Nursing note and vitals reviewed.    Musculoskeletal Exam: C-spine, thoracic spine, lumbar spine good range of motion.  No midline spinal tenderness.  No SI joint tenderness.  Shoulder joints, elbow joints, wrist joints, MCPs, PIPs, DIPs good range of motion with no synovitis.  He has good range of motion of bilateral hip joints.  No tenderness of trochanteric bursa.  Knee joints, ankle joints, MTPs, PIPs, DIPs good range of motion with no synovitis.  No tenderness plantar fascia or Achilles tendons bilaterally.  No warmth or effusion of bilateral knee joints.  CDAI Exam: No CDAI exam completed.    Investigation: No additional findings.  CBC Latest Ref Rng & Units 07/03/2017 11/14/2016 05/07/2016  WBC 3.8 - 10.8 Thousand/uL 5.5 4.4 5.2  Hemoglobin 13.2 - 17.1 g/dL 16.114.5 09.614.8 04.514.8  Hematocrit 38.5 - 50.0 % 41.3 43.0 43.6  Platelets 140 - 400 Thousand/uL 201 186 182   CMP Latest Ref Rng & Units 07/03/2017 11/14/2016 05/07/2016  Glucose 65 - 139 mg/dL 409104 92 811(B106(H)  BUN 7 - 25 mg/dL 13 15 14   Creatinine 0.60 - 1.35 mg/dL 1.470.82 8.290.72 5.620.86  Sodium 135 - 146 mmol/L 141 140 141  Potassium 3.5 - 5.3 mmol/L 4.1 4.7 4.4  Chloride 98 - 110 mmol/L 104 104 103  CO2 20 - 32 mmol/L 28 31 27   Calcium 8.6 - 10.3 mg/dL 9.2 9.3 9.1  Total Protein 6.1 - 8.1 g/dL 7.3 7.4 7.0  Total Bilirubin 0.2 - 1.2 mg/dL 0.6 0.9 0.9  Alkaline Phos 40 - 115 U/L - - 46  AST 10 -  40 U/L 19 18 24   ALT 9 - 46 U/L 17 18 25     Imaging: No results found.  Speciality Comments: No specialty comments available.    Procedures:  No procedures performed Allergies: Nickel; Ibuprofen; and Penicillins   Assessment / Plan:     Visit Diagnoses: Psoriatic arthritis [facility](HCC): He has no synovitis or dactylitis on exam.  He has no pain or swelling in his hands or feet.  No achilles tendonitis or plantar fasciitis.  No SI joint tenderness.  He has been having left hip pain for 1-2 months. He has good ROM on exam.  A x-ray of his left hip was obtained today. He has active psoriasis patches on torso, bilateral elbows, and bilateral knee joints.  He has been injecting Enbrel 50 mg subcutaneously once weekly. He will continue on this current treatment regimen. He was due for his last Enbrel injection on  Friday, but a refill was not sent to the pharmacy since he needed a follow up visit.  We provided a sample of Enbrel in the office today.    Psoriasis: He has active psoriasis patches on his torso, bilateral elbows, bilateral knees.  He was given a prescription for clobetasol cream which can apply topically.  High risk medication use - Enbrel 50 mg sq q week TB gold 10/08/16. CBC and CMP 07/03/17 -future order for TB gold was ordered today.  He will be due for CBC and CMP in August and every 3 months.  Plan: QuantiFERON-TB Gold Plus  Pain in left hip -he has been having increased discomfort in his left hip for the past 1 to 2 months.  He has good range of motion on exam today.  He reports left-sided sciatica intermittently.  An x-ray of his left hip was obtained today.  The x-ray of his left hip was unremarkable.  Due to his allergy to ibuprofen and NSAIDs, we will not start him on a trial of Celebrex.  He would like a referral to Dr. Alvester Morin for a left hip intraarticular cortisone injection. plan: XR HIP UNILAT W OR W/O PELVIS 2-3 VIEWS LEFT    Orders: Orders Placed This Encounter  Procedures  .  XR HIP UNILAT W OR W/O PELVIS 2-3 VIEWS LEFT  . QuantiFERON-TB Gold Plus   Meds ordered this encounter  Medications  . clobetasol cream (TEMOVATE) 0.05 %    Sig: Apply 1 application topically 2 (two) times daily.    Dispense:  30 g    Refill:  1    Face-to-face time spent with patient was 30 minutes. >50% of time was spent in counseling and coordination of care.  Follow-Up Instructions: Return in about 5 months (around 12/15/2017) for Psoriatic arthritis.   Gearldine Bienenstock, PA-C  Note - This record has been created using Dragon software.  Chart creation errors have been sought, but may not always  have been located. Such creation errors do not reflect on  the standard of medical care.

## 2017-07-14 NOTE — Telephone Encounter (Signed)
Patient called stating he is returning your call from last week.

## 2017-07-15 ENCOUNTER — Ambulatory Visit (INDEPENDENT_AMBULATORY_CARE_PROVIDER_SITE_OTHER): Payer: Self-pay

## 2017-07-15 ENCOUNTER — Ambulatory Visit: Payer: BC Managed Care – PPO | Admitting: Physician Assistant

## 2017-07-15 ENCOUNTER — Encounter: Payer: Self-pay | Admitting: Physician Assistant

## 2017-07-15 VITALS — BP 106/72 | HR 68 | Ht 71.0 in | Wt 258.0 lb

## 2017-07-15 DIAGNOSIS — M25552 Pain in left hip: Secondary | ICD-10-CM

## 2017-07-15 DIAGNOSIS — Z8639 Personal history of other endocrine, nutritional and metabolic disease: Secondary | ICD-10-CM | POA: Diagnosis not present

## 2017-07-15 DIAGNOSIS — L4050 Arthropathic psoriasis, unspecified: Secondary | ICD-10-CM

## 2017-07-15 DIAGNOSIS — Z79899 Other long term (current) drug therapy: Secondary | ICD-10-CM

## 2017-07-15 DIAGNOSIS — L409 Psoriasis, unspecified: Secondary | ICD-10-CM

## 2017-07-15 MED ORDER — ETANERCEPT 50 MG/ML ~~LOC~~ SOAJ: SUBCUTANEOUS | 0 refills | Status: DC

## 2017-07-15 MED ORDER — CLOBETASOL PROPIONATE 0.05 % EX CREA: 1.0000 "application " | TOPICAL_CREAM | Freq: Two times a day (BID) | CUTANEOUS | 1 refills | Status: DC

## 2017-07-15 NOTE — Patient Instructions (Addendum)
Standing Labs We placed an order today for your standing lab work.    Please come back and get your standing labs in August and every 3 months   TB gold, CBC, CMP   We have open lab Monday through Friday from 8:30-11:30 AM and 1:30-4:00 PM  at the office of Dr. Pollyann Savoy.   You may experience shorter wait times on Monday and Friday afternoons. The office is located at [address], [place], [place] [address] No appointment is necessary.   Labs are drawn by [facility].  You may receive a bill from [facility] for your lab work. If you have any questions regarding directions or hours of operation,  please call [phone].     Hip Exercises Ask your health care provider which exercises are safe for you. Do exercises exactly as told by your health care provider and adjust them as directed. It is normal to feel mild stretching, pulling, tightness, or discomfort as you do these exercises, but you should stop right away if you feel sudden pain or your pain gets worse.Do not begin these exercises until told by your health care provider. STRETCHING AND RANGE OF MOTION EXERCISES These exercises warm up your muscles and joints and improve the movement and flexibility of your hip. These exercises also help to relieve pain, numbness, and tingling. Exercise A: Hamstrings, Supine  1. Lie on your back. 2. Loop a belt or towel over the ball of your left / rightfoot. The ball of your foot is on the walking surface, right under your toes. 3. Straighten your left / rightknee and slowly pull on the belt to raise your leg. ? Do not let your left / right knee bend while you do this. ? Keep your other leg flat on the floor. ? Raise the left / right leg until you feel a gentle stretch behind your left / right knee or thigh. 4. Hold this position for __________ seconds. 5. Slowly return your leg to the starting position. Repeat __________ times. Complete this stretch __________ times a  day. Exercise B: Hip Rotators  1. Lie on your back on a firm surface. 2. Hold your left / right knee with your left / right hand. Hold your ankle with your other hand. 3. Gently pull your left / right knee and rotate your lower leg toward your other shoulder. ? Pull until you feel a stretch in your buttocks. ? Keep your hips and shoulders firmly planted while you do this stretch. 4. Hold this position for __________ seconds. Repeat __________ times. Complete this stretch __________ times a day. Exercise C: V-Sit (Hamstrings and Adductors)  1. Sit on the floor with your legs extended in a large "V" shape. Keep your knees straight during this exercise. 2. Start with your head and chest upright, then bend at your waist to reach for your left foot (position A). You should feel a stretch in your right inner thigh. 3. Hold this position for __________ seconds. Then slowly return to the upright position. 4. Bend at your waist to reach forward (position B). You should feel a stretch behind both of your thighs and knees. 5. Hold this position for __________ seconds. Then slowly return to the upright position. 6. Bend at your waist to reach for your right foot (position C). You should feel a stretch in your left inner thigh. 7. Hold this position for __________ seconds. Then slowly return to the upright position. Repeat __________ times. Complete this stretch __________ times  a day. Exercise D: Lunge (Hip Flexors)  1. Place your left / right knee on the floor and bend your other knee so that is directly over your ankle. You should be half-kneeling. 2. Keep good posture with your head over your shoulders. 3. Tighten your buttocks to point your tailbone downward. This helps your back to keep from arching too much. 4. You should feel a gentle stretch in the front of your left / right thigh and hip. If you do not feel any resistance, slightly slide your other foot forward and then slowly lunge forward so  your knee once again lines up over your ankle. 5. Make sure your tailbone continues to point downward. 6. Hold this position for __________ seconds. Repeat __________ times. Complete this stretch __________ times a day. STRENGTHENING EXERCISES These exercises build strength and endurance in your hip. Endurance is the ability to use your muscles for a long time, even after they get tired. Exercise E: Bridge (Hip Extensors)  1. Lie on your back on a firm surface with your knees bent and your feet flat on the floor. 2. Tighten your buttocks muscles and lift your bottom off the floor until the trunk of your body is level with your thighs. ? Do not arch your back. ? You should feel the muscles working in your buttocks and the back of your thighs. If you do not feel these muscles, slide your feet 1-2 inches (2.5-5 cm) farther away from your buttocks. 3. Hold this position for __________ seconds. 4. Slowly lower your hips to the starting position. 5. Let your muscles relax completely between repetitions. 6. If this exercise is too easy, try doing it with your arms crossed over your chest. Repeat __________ times. Complete this exercise __________ times a day. Exercise F: Straight Leg Raises - Hip Abductors  1. Lie on your side with your left / right leg in the top position. Lie so your head, shoulder, knee, and hip line up with each other. You may bend your bottom knee to help you balance. 2. Roll your hips slightly forward, so your hips are stacked directly over each other and your left / right knee is facing forward. 3. Leading with your heel, lift your top leg 4-6 inches (10-15 cm). You should feel the muscles in your outer hip lifting. ? Do not let your foot drift forward. ? Do not let your knee roll toward the ceiling. 4. Hold this position for __________ seconds. 5. Slowly return to the starting position. 6. Let your muscles relax completely between repetitions. Repeat __________ times.  Complete this exercise __________ times a day. Exercise G: Straight Leg Raises - Hip Adductors  1. Lie on your side with your left / right leg in the bottom position. Lie so your head, shoulder, knee, and hip line up. You may place your upper foot in front to help you balance. 2. Roll your hips slightly forward, so your hips are stacked directly over each other and your left / right knee is facing forward. 3. Tense the muscles in your inner thigh and lift your bottom leg 4-6 inches (10-15 cm). 4. Hold this position for __________ seconds. 5. Slowly return to the starting position. 6. Let your muscles relax completely between repetitions. Repeat __________ times. Complete this exercise __________ times a day. Exercise H: Straight Leg Raises - Quadriceps  1. Lie on your back with your left / right leg extended and your other knee bent. 2. Tense the muscles in the  front of your left / right thigh. When you do this, you should see your kneecap slide up or see increased dimpling just above your knee. 3. Tighten these muscles even more and raise your leg 4-6 inches (10-15 cm) off the floor. 4. Hold this position for __________ seconds. 5. Keep these muscles tense as you lower your leg. 6. Relax the muscles slowly and completely between repetitions. Repeat __________ times. Complete this exercise __________ times a day. Exercise I: Hip Abductors, Standing 1. Tie one end of a rubber exercise band or tubing to a secure surface, such as a table or pole. 2. Loop the other end of the band or tubing around your left / right ankle. 3. Keeping your ankle with the band or tubing directly opposite of the secured end, step away until there is tension in the tubing or band. Hold onto a chair as needed for balance. 4. Lift your left / right leg out to your side. While you do this: ? Keep your back upright. ? Keep your shoulders over your hips. ? Keep your toes pointing forward. ? Make sure to use your hip  muscles to lift your leg. Do not "throw" your leg or tip your body to lift your leg. 5. Hold this position for __________ seconds. 6. Slowly return to the starting position. Repeat __________ times. Complete this exercise __________ times a day. Exercise J: Squats (Quadriceps) 1. Stand in a door frame so your feet and knees are in line with the frame. You may place your hands on the frame for balance. 2. Slowly bend your knees and lower your hips like you are going to sit in a chair. ? Keep your lower legs in a straight-up-and-down position. ? Do not let your hips go lower than your knees. ? Do not bend your knees lower than told by your health care provider. ? If your hip pain increases, do not bend as low. 3. Hold this position for ___________ seconds. 4. Slowly push with your legs to return to standing. Do not use your hands to pull yourself to standing. Repeat __________ times. Complete this exercise __________ times a day. This information is not intended to replace advice given to you by your health care provider. Make sure you discuss any questions you have with your health care provider. Document Released: 02/08/2005 Document Revised: 10/16/2015 Document Reviewed: 01/16/2015 Elsevier Interactive Patient Education  [employer]2018 [employer] Inc.

## 2017-07-28 ENCOUNTER — Telehealth: Payer: Self-pay | Admitting: Rheumatology

## 2017-07-28 MED ORDER — DICLOFENAC SODIUM 1 % TD GEL: TRANSDERMAL | 3 refills | Status: DC

## 2017-07-28 NOTE — Telephone Encounter (Signed)
Patient calling in reference to possible flare starting in rt ankle. Patient wants to know if there is anything he can do, or rx to stop flare from progressing. [facility] [place] Please call to advise.

## 2017-07-28 NOTE — Telephone Encounter (Signed)
Patient also requested a refill on Voltaren Gel.   Last Visit: 07/15/17 Next Visit: 12/15/17  Okay to refill per Dr. Corliss Skainseveshwar  Patient just returned from vacation. Patient states that his right ankle is stiffening up. Patient states he is having pain but no swelling. Patient states he has not had an Enbrel injection in 2 weeks. Patient states his prescription is due to be delivered on  06/29/17. Please advise.

## 2017-07-28 NOTE — Telephone Encounter (Signed)
Patient called back to request Voltaren Gel which he has had in the past to [facility] [place] due to his increased pain in the right ankle.  Patient states his Enbrel will not be delivered until Wednesday.

## 2017-07-29 ENCOUNTER — Other Ambulatory Visit: Payer: Self-pay | Admitting: Physician Assistant

## 2017-07-29 ENCOUNTER — Telehealth: Payer: Self-pay | Admitting: Physician Assistant

## 2017-07-29 MED ORDER — PREDNISONE 5 MG PO TABS: ORAL_TABLET | ORAL | 0 refills | Status: DC

## 2017-07-29 NOTE — Telephone Encounter (Signed)
I called patient to discuss the right ankle pain and joint stiffness he has been experiencing.  He was recently on vacation and missed 2 doses of Enbrel.  He has been using Voltaren gel since last night, and he has noticed some improvement in the discomfort.  He continues to have significant stiffness but no joint swelling.  We discussed treatment options including a right ankle cortisone injection vs. Prednisone taper.  He requested a prednisone taper. He has been on tapers in the past and tolerated them well. I will send in a prednisone taper starting at 20 mg and tapering by 4 mg every 2 days.  He will inject his next Enbrel subcutaneous injection tomorrow.  He was advised to notify us if his ankle pain and stiffness does not improve after the prednisone taper. All questions were addressed.

## 2017-08-14 ENCOUNTER — Ambulatory Visit (INDEPENDENT_AMBULATORY_CARE_PROVIDER_SITE_OTHER): Payer: Self-pay | Admitting: Physical Medicine and Rehabilitation

## 2017-10-09 ENCOUNTER — Other Ambulatory Visit: Payer: Self-pay | Admitting: Physician Assistant

## 2017-10-09 DIAGNOSIS — Z79899 Other long term (current) drug therapy: Secondary | ICD-10-CM

## 2017-10-09 NOTE — Telephone Encounter (Signed)
Last visit: 07/15/2017 Next visit: 12/15/2017 Labs: 07/03/2017 WNL  TB Gold: 10/08/2016 negative   Advised patient he is due to update labs. Patient states he will have labs drawn at [facility] in [place]. Lab orders have been released.   Okay to refill 30 day supply, per Dr. Corliss Skains.

## 2017-11-04 ENCOUNTER — Telehealth: Payer: Self-pay | Admitting: Rheumatology

## 2017-11-04 NOTE — Telephone Encounter (Signed)
Patient called stating he is returning your call regarding his bloodwork.  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

## 2017-11-04 NOTE — Telephone Encounter (Signed)
Patient advised lab results are normal 

## 2017-11-05 LAB — COMPLETE METABOLIC PANEL WITH GFR
AG RATIO: 1.6 (calc) (ref 1.0–2.5)
ALKALINE PHOSPHATASE (APISO): 57 U/L (ref 40–115)
ALT: 15 U/L (ref 9–46)
AST: 16 U/L (ref 10–40)
Albumin: 4.6 g/dL (ref 3.6–5.1)
BILIRUBIN TOTAL: 0.7 mg/dL (ref 0.2–1.2)
BUN: 17 mg/dL (ref 7–25)
CHLORIDE: 102 mmol/L (ref 98–110)
CO2: 29 mmol/L (ref 20–32)
Calcium: 9.4 mg/dL (ref 8.6–10.3)
Creat: 0.93 mg/dL (ref 0.60–1.35)
GFR, EST NON AFRICAN AMERICAN: 107 mL/min/{1.73_m2} (ref >=60)
GFR, Est African American: 124 mL/min/{1.73_m2} (ref >=60)
GLOBULIN: 2.8 g/dL (ref 1.9–3.7)
Glucose, Bld: 93 mg/dL (ref 65–139)
POTASSIUM: 4.3 mmol/L (ref 3.5–5.3)
SODIUM: 136 mmol/L (ref 135–146)
Total Protein: 7.4 g/dL (ref 6.1–8.1)

## 2017-11-05 LAB — QUANTIFERON-TB GOLD PLUS
Mitogen-NIL: >10 IU/mL
NIL: 0.05 IU/mL
QuantiFERON-TB Gold Plus: NEGATIVE
TB1-NIL: 0 [IU]/mL
TB2-NIL: <0 IU/mL

## 2017-11-05 LAB — CBC WITH DIFFERENTIAL/PLATELET
BASOS ABS: 78 {cells}/uL (ref 0–200)
Basophils Relative: 1.1 %
Eosinophils Absolute: 71 cells/uL (ref 15–500)
Eosinophils Relative: 1 %
HEMATOCRIT: 44.7 % (ref 38.5–50.0)
Hemoglobin: 15.3 g/dL (ref 13.2–17.1)
LYMPHS ABS: 1853 {cells}/uL (ref 850–3900)
MCH: 30.5 pg (ref 27.0–33.0)
MCHC: 34.2 g/dL (ref 32.0–36.0)
MCV: 89.2 fL (ref 80.0–100.0)
MPV: 10.8 fL (ref 7.5–12.5)
Monocytes Relative: 8 %
Neutro Abs: 4530 cells/uL (ref 1500–7800)
Neutrophils Relative %: 63.8 %
PLATELETS: 219 10*3/uL (ref 140–400)
RBC: 5.01 10*6/uL (ref 4.20–5.80)
RDW: 12.7 % (ref 11.0–15.0)
TOTAL LYMPHOCYTE: 26.1 %
WBC: 7.1 10*3/uL (ref 3.8–10.8)
WBCMIX: 568 {cells}/uL (ref 200–950)

## 2017-11-05 NOTE — Telephone Encounter (Signed)
TB gold negative

## 2017-11-07 ENCOUNTER — Telehealth: Payer: Self-pay | Admitting: Pharmacy Technician

## 2017-11-07 NOTE — Telephone Encounter (Signed)
Received a Prior Authorization request from [facility] for Enbrel. Authorization has been submitted to patient's insurance via Fax. Will update once we receive a response.  Phone- [phone] Fax- [phone]  8:51 AM Dorthula Nettles, CPhT

## 2017-11-11 NOTE — Telephone Encounter (Signed)
Received a fax from [facility] regarding a prior authorization for Enbrel. Authorization has been APPROVED from 11/07/17 to 11/08/19.   Will send document to scan center.  Authorization # [id number]   8:23 AM Dorthula Nettles, CPhT

## 2017-11-24 ENCOUNTER — Other Ambulatory Visit: Payer: Self-pay | Admitting: Rheumatology

## 2017-11-24 NOTE — Telephone Encounter (Signed)
Last visit: 07/15/2017 Next visit: 12/15/2017 Labs: 11/03/17 WNL Tb gold: 11/03/17 Neg   Okay to refill per Dr. Corliss Skains

## 2017-11-25 ENCOUNTER — Telehealth: Payer: Self-pay | Admitting: Rheumatology

## 2017-11-25 MED ORDER — PREDNISONE 5 MG PO TABS: ORAL_TABLET | ORAL | 0 refills | Status: DC

## 2017-11-25 NOTE — Telephone Encounter (Signed)
Patient states he is having right ankle pain and onset of pain was on Sunday. Patient states a chiropractor friend told him it was tendonitis. Patient has psoriatic arthritis and is currently on Enbrel. Patient states he has taken enbrel on schedule and has not missed a dose. 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 Patient states he is unable to take NSAIDs or ibuprofen due to an allergy. Patient is requesting a prednisone taper. Please advise.  Last visit: 07/15/2017 Next visit: 12/15/2017

## 2017-11-25 NOTE — Telephone Encounter (Signed)
Patient called stating his right ankle is painful and is requesting a prescription of [facility] [address] in [place].

## 2017-11-25 NOTE — Telephone Encounter (Signed)
Advised patient we would call in a prednisone taper, patient requested [facility] be [facility] [place].

## 2017-11-25 NOTE — Telephone Encounter (Signed)
Okay to call in prednisone taper starting at 20 mg p.o. daily and taper by 5 mg every 4 days.

## 2017-12-10 NOTE — Progress Notes (Signed)
Office Visit Note  Patient: Colin Jensen             Date of Birth: 1986-01-22           MRN: [id number]             PCP: Wendall Papa Referring: Wendall Papa, NP Visit Date: 12/15/2017 Occupation: @GUAROCC @  Subjective:  Medication monitoring   History of Present Illness: Colin Jensen is a 32 y.o. male with history of psoriatic arthritis. He is on Enbrel 50 mg sq injections once weekly.  He has not missed any doses of Enbrel recently.  He has not had the yearly influenza vaccine.  Patient reports that over the past several months he has had several flares in the past week with days.  He is typically ice the affected joint and resolves within a few days.  He took a prednisone taper starting on 11/25/2017 for right ankle pain.  Prednisone resolved to the flare.  He states that in August he twisted his left ankle and was evaluated by an orthopedist.  According to the patient he has an old left fifth metatarsal fracture that occurred several years ago.  He states he was placed in a boot which led to increased pain and swelling in his left knee.  He had a left knee aspiration performed by Dr. Case in [place].  He has not had any recurrence of the left knee effusion.  He denies any plantar fasciitis or Achilles tendinitis.  He denies any SI joint pain.  He states he continues to have psoriasis on his scalp, torso, and right elbow.   Activities of Daily Living:  Patient reports morning stiffness for 2 minutes.   Patient Denies nocturnal pain.  Difficulty dressing/grooming: Denies Difficulty climbing stairs: Denies Difficulty getting out of chair: Denies Difficulty using hands for taps, buttons, cutlery, and/or writing: Denies  Review of Systems  Constitutional: Negative for fatigue and night sweats.  HENT: Negative for mouth sores, trouble swallowing, trouble swallowing, mouth dryness and nose dryness.   Eyes: Negative for redness, visual disturbance and dryness.  Respiratory:  Negative for cough, hemoptysis, shortness of breath and difficulty breathing.   Cardiovascular: Negative for chest pain, palpitations, hypertension, irregular heartbeat and swelling in legs/feet.  Gastrointestinal: Negative for blood in stool, constipation and diarrhea.  Endocrine: Negative for increased urination.  Genitourinary: Negative for painful urination.  Musculoskeletal: Positive for arthralgias, joint pain, joint swelling and morning stiffness. Negative for myalgias, muscle weakness, muscle tenderness and myalgias.  Skin: Positive for rash. Negative for color change, hair loss, nodules/bumps, skin tightness, ulcers and sensitivity to sunlight.  Allergic/Immunologic: Negative for susceptible to infections.  Neurological: Negative for dizziness, fainting, memory loss, night sweats and weakness.  Hematological: Negative for swollen glands.  Psychiatric/Behavioral: Negative for depressed mood and sleep disturbance. The patient is not nervous/anxious.     PMFS History:  Patient Active Problem List   Diagnosis Date Noted  . History of obesity 05/01/2016  . Psoriatic arthritis (HCC) 12/06/2015  . Psoriasis 12/06/2015  . High risk medication use 12/06/2015    Past Medical History:  Diagnosis Date  . Psoriatic arthritis (HCC)     Family History  Problem Relation Age of Onset  . Heart attack Father   . Psoriasis Father   . Diabetes Sister    History reviewed. No pertinent surgical history. Social History   Social History Narrative  . Not on file    Objective: Vital Signs: BP 110/75 (BP Location: Left  Arm, Patient Position: Sitting, Cuff Size: Large)   Pulse 74   Resp 15   Ht 5\' 11"  (1.803 m)   Wt 264 lb 12.8 oz (120.1 kg)   BMI 36.93 kg/m    Physical Exam  Constitutional: He is oriented to person, place, and time. He appears well-developed and well-nourished.  HENT:  Head: Normocephalic and atraumatic.  Eyes: Pupils are equal, round, and reactive to light.  Conjunctivae and EOM are normal.  Neck: Normal range of motion. Neck supple.  Cardiovascular: Normal rate, regular rhythm and normal heart sounds.  Pulmonary/Chest: Effort normal and breath sounds normal.  Abdominal: Soft. Bowel sounds are normal.  Lymphadenopathy:    He has no cervical adenopathy.  Neurological: He is alert and oriented to person, place, and time.  Skin: Skin is warm and dry. Capillary refill takes less than 2 seconds.  Nail pitting in all fingernails.  Psoriasis on scalp, small patch on extensor surface of right elbow, and torso  Psychiatric: He has a normal mood and affect. His behavior is normal.  Nursing note and vitals reviewed.    Musculoskeletal Exam: C-spine, thoracic spine, and lumbar spine good ROM.  No midline spinal tenderness.  No SI joint tenderness.  Shoulder joints, elbow joints, wrist joints, MCPs, PIPs, and DIPs good ROM with no synovitis.  Hip joints, knee joints, ankle joints, MTPs, PIPs, and DIPs good ROM with no synovitis.  No warmth or effusion of knee joints. No tenderness or swelling of ankle joints.  No plantar fasciitis or achilles tendonitis.    CDAI Exam: CDAI Score: Not documented Patient Global Assessment: Not documented; Provider Global Assessment: Not documented Swollen: Not documented; Tender: Not documented Joint Exam   Not documented   There is currently no information documented on the homunculus. Go to the Rheumatology activity and complete the homunculus joint exam.  Investigation: No additional findings.  Imaging: No results found.  Recent Labs: Lab Results  Component Value Date   WBC 7.1 11/03/2017   HGB 15.3 11/03/2017   PLT 219 11/03/2017   NA 136 11/03/2017   K 4.3 11/03/2017   CL 102 11/03/2017   CO2 29 11/03/2017   GLUCOSE 93 11/03/2017   BUN 17 11/03/2017   CREATININE 0.93 11/03/2017   BILITOT 0.7 11/03/2017   ALKPHOS 46 05/07/2016   AST 16 11/03/2017   ALT 15 11/03/2017   PROT 7.4 11/03/2017   ALBUMIN  4.2 05/07/2016   CALCIUM 9.4 11/03/2017   GFRAA 124 11/03/2017   QFTBGOLDPLUS NEGATIVE 11/03/2017    Speciality Comments: No specialty comments available.  Procedures:  No procedures performed Allergies: Nickel; Ibuprofen; and Penicillins   Assessment / Plan:     Visit Diagnoses: Psoriatic arthritis (HCC) -He has no synovitis or dactylitis on exam today.  He has been having intermittent flares over the past several months. He has not missed any doses of Enbrel recently. He continues to have active psoriasis.  He has no achilles tendonitis or plantar fasciitis.  He has no SI joint tenderness.  We discussed switching from Enbrel to Cosentyx.  Indications, contraindications, and potential side effects of cosentyx were discussed.  He will notify us when he would like to switch.  He recently received a refill of Enbrel, and he does not want to switch at this time.  He was given a handout of information about Cosentyx.  X-rays of both hands and feet were obtained today to assess for radiographic progression.  He was strongly encouraged to receive the yearly  influenza vaccine.  He will follow up in the office in 5 months.  He will notify us if he develops increased joint pain or joint swelling. Plan: XR Hand 2 View Right, XR Hand 2 View Left, XR Foot 2 Views Left, XR Foot 2 Views Right  Psoriasis: He continues to have scalp psoriasis and patches on his torso.  He has a new patch of psoriasis on the extensor surface of the right elbow joint.  He has nail pitting in all fingernails.   High risk medication use -Current regimen includes Enbrel 50 mg subq weekly .  He was given a prednisone taper on 11/25/17, which he completed.  Most recent TB gold negative on 11/03/17.  Last CBC/CMP within normal limits on 11/03/17.  Next labs due end of December and standing order placed today.  Plan: CBC with Differential/Platelet, COMPLETE METABOLIC PANEL WITH GFR  Pain in left hip - XR unremarkable. He has no discomfort  at this time.  He has good ROM on exam.   Orders: Orders Placed This Encounter  Procedures  . XR Hand 2 View Right  . XR Hand 2 View Left  . XR Foot 2 Views Left  . XR Foot 2 Views Right  . CBC with Differential/Platelet  . COMPLETE METABOLIC PANEL WITH GFR   No orders of the defined types were placed in this encounter.   Face-to-face time spent with patient was . Greater than 50% of time was spent in counseling and coordination of care.  Follow-Up Instructions: Return in about 5 months (around 05/16/2018) for Psoriatic arthritis.   Gearldine Bienenstock, PA-C  Note - This record has been created using Dragon software.  Chart creation errors have been sought, but may not always  have been located. Such creation errors do not reflect on  the standard of medical care.

## 2017-12-15 ENCOUNTER — Ambulatory Visit (INDEPENDENT_AMBULATORY_CARE_PROVIDER_SITE_OTHER): Payer: Self-pay

## 2017-12-15 ENCOUNTER — Encounter: Payer: Self-pay | Admitting: Physician Assistant

## 2017-12-15 ENCOUNTER — Ambulatory Visit: Payer: BC Managed Care – PPO | Admitting: Physician Assistant

## 2017-12-15 VITALS — BP 110/75 | HR 74 | Resp 15 | Ht 71.0 in | Wt 264.8 lb

## 2017-12-15 DIAGNOSIS — Z79899 Other long term (current) drug therapy: Secondary | ICD-10-CM | POA: Diagnosis not present

## 2017-12-15 DIAGNOSIS — M25571 Pain in right ankle and joints of right foot: Secondary | ICD-10-CM

## 2017-12-15 DIAGNOSIS — L4050 Arthropathic psoriasis, unspecified: Secondary | ICD-10-CM

## 2017-12-15 DIAGNOSIS — M25552 Pain in left hip: Secondary | ICD-10-CM | POA: Diagnosis not present

## 2017-12-15 DIAGNOSIS — M25572 Pain in left ankle and joints of left foot: Secondary | ICD-10-CM

## 2017-12-15 DIAGNOSIS — L409 Psoriasis, unspecified: Secondary | ICD-10-CM

## 2017-12-15 DIAGNOSIS — M25541 Pain in joints of right hand: Secondary | ICD-10-CM

## 2017-12-15 DIAGNOSIS — M25542 Pain in joints of left hand: Secondary | ICD-10-CM | POA: Diagnosis not present

## 2017-12-15 NOTE — Patient Instructions (Addendum)
Standing Labs We placed an order today for your standing lab work.    Please come back and get your standing labs in December and every 3 months   We have open lab Monday through Friday from 8:30-11:30 AM and 1:30-4:00 PM  at the office of Dr. Pollyann Savoy.   You may experience shorter wait times on Monday and Friday afternoons. The office is located at [address], [address], [place], [place] [address] No appointment is necessary.   Labs are drawn by [facility].  You may receive a bill from [facility] for your lab work. If you have any questions regarding directions or hours of operation,  please call [phone].   Just as a reminder please drink plenty of water prior to coming for your lab work. Thanks!   Secukinumab injection What is this medicine? SECUKINUMAB (sek ue KIN ue mab) is used to treat psoriasis. It is also used to treat psoriatic arthritis and ankylosing spondylitis. This medicine may be used for other purposes; ask your health care provider or pharmacist if you have questions. COMMON BRAND NAME(S): Cosentyx What should I tell my health care provider before I take this medicine? They need to know if you have any of these conditions: -Crohn's disease, ulcerative colitis, or other inflammatory bowel disease -infection or history of infection -other conditions affecting the immune system -recently received or are scheduled to receive a vaccine -tuberculosis, a positive skin test for tuberculosis, or have recently been in close contact with someone who has tuberculosis -an unusual or allergic reaction to secukinumab, other medicines, latex, rubber, foods, dyes, or preservatives -pregnant or trying to get pregnant -breast-feeding How should I use this medicine? This medicine is for injection under the skin. It may be administered by a healthcare professional in a hospital or clinic setting or at home. If you get this medicine at home, you will be taught how to prepare and  give this medicine. Use exactly as directed. Take your medicine at regular intervals. Do not take your medicine more often than directed. It is important that you put your used needles and syringes in a special sharps container. Do not put them in a trash can. If you do not have a sharps container, call your pharmacist or healthcare provider to get one. A special MedGuide will be given to you by the pharmacist with each prescription and refill. Be sure to read this information carefully each time. Talk to your pediatrician regarding the use of this medicine in children. Special care may be needed. Overdosage: If you think you have taken too much of this medicine contact a poison control center or emergency room at once. NOTE: This medicine is only for you. Do not share this medicine with others. What if I miss a dose? It is important not to miss your dose. Call your doctor of health care professional if you are unable to keep an appointment. If you give yourself the medicine and you miss a dose, take it as soon as you can. If it is almost time for your next dose, take only that dose. Do not take double or extra doses. What may interact with this medicine? Do not take this medicine with any of the following medications: -live virus vaccines This medicine may also interact with the following medications: -cyclosporine -inactivated vaccines -warfarin This list may not describe all possible interactions. Give your health care provider a list of all the medicines, herbs, non-prescription drugs, or dietary supplements you use. Also tell them  if you smoke, drink alcohol, or use illegal drugs. Some items may interact with your medicine. What should I watch for while using this medicine? Tell your doctor or healthcare professional if your symptoms do not start to get better or if they get worse. You will be tested for tuberculosis (TB) before you start this medicine. If your doctor prescribes any medicine  for TB, you should start taking the TB medicine before starting this medicine. Make sure to finish the full course of TB medicine. Call your doctor or healthcare professional for advice if you get a fever, chills or sore throat, or other symptoms of a cold or flu. Do not treat yourself. This drug decreases your body's ability to fight infections. Try to avoid being around people who are sick. This medicine can decrease the response to a vaccine. If you need to get vaccinated, tell your healthcare professional if you have received this medicine within the last 6 months. Extra booster doses may be needed. Talk to your doctor to see if a different vaccination schedule is needed. What side effects may I notice from receiving this medicine? Side effects that you should report to your doctor or health care professional as soon as possible: -allergic reactions like skin rash, itching or hives, swelling of the face, lips, or tongue -signs and symptoms of infection like fever or chills; cough; sore throat; pain or trouble passing urine Side effects that usually do not require medical attention (report to your doctor or health care professional if they continue or are bothersome): -diarrhea This list may not describe all possible side effects. Call your doctor for medical advice about side effects. You may report side effects to FDA at 1-800-FDA-1088. Where should I keep my medicine? Keep out of the reach of children. Store the prefilled syringe or injection pen in a refrigerator between 2 to 8 degrees C (36 to 46 degrees F). Keep the syringe or the pen in the original carton until ready for use. Protect from light. Do not freeze. Do not shake. Prior to use, remove the syringe or pen from the refrigerator and use within 1 hour. Throw away any unused medicine after the expiration date on the label. NOTE: This sheet is a summary. It may not cover all possible information. If you have questions about this medicine,  talk to your doctor, pharmacist, or health care provider.  2018 Elsevier/Gold Standard (2015-02-23 11:48:31)

## 2017-12-26 ENCOUNTER — Telehealth: Payer: Self-pay | Admitting: Pharmacy Technician

## 2017-12-26 NOTE — Telephone Encounter (Signed)
Called patient to follow up if he had decided if he wanted to switch from Enbrel to Cosentyx. Per last OV note, patient discussed but did not decide.  Left message for patient to call back. Per patient's dad, patient has been waiting to hear from us.  11:58 AM Dorthula Nettlesachael N Iziah Cates, CPhT

## 2018-02-11 ENCOUNTER — Other Ambulatory Visit: Payer: Self-pay | Admitting: Rheumatology

## 2018-02-11 NOTE — Telephone Encounter (Addendum)
Last Visit: 12/15/17 Next visit: 05/14/18 Labs: 11/03/17 WNL TB Gold: 11/03/17 Neg   Left message to advise patient he is due to update labs.   Okay to refill 30 day supply per Dr. Corliss Skains

## 2018-02-12 ENCOUNTER — Telehealth: Payer: Self-pay | Admitting: Rheumatology

## 2018-02-12 DIAGNOSIS — Z79899 Other long term (current) drug therapy: Secondary | ICD-10-CM

## 2018-02-12 NOTE — Telephone Encounter (Signed)
Patient left message at 1:26pm [facility]. Patient stated he had an appt for this pm. Wanted you to release orders for labs. Please call to discuss.

## 2018-02-12 NOTE — Telephone Encounter (Signed)
Patient advised lab orders have been released.  

## 2018-02-14 LAB — CBC WITH DIFFERENTIAL/PLATELET
Absolute Monocytes: 429 cells/uL (ref 200–950)
BASOS PCT: 1.5 %
Basophils Absolute: 80 cells/uL (ref 0–200)
EOS PCT: 3.2 %
Eosinophils Absolute: 170 cells/uL (ref 15–500)
HCT: 42.5 % (ref 38.5–50.0)
HEMOGLOBIN: 15 g/dL (ref 13.2–17.1)
Lymphs Abs: 1537 cells/uL (ref 850–3900)
MCH: 31.4 pg (ref 27.0–33.0)
MCHC: 35.3 g/dL (ref 32.0–36.0)
MCV: 89.1 fL (ref 80.0–100.0)
MONOS PCT: 8.1 %
MPV: 11 fL (ref 7.5–12.5)
NEUTROS ABS: 3085 {cells}/uL (ref 1500–7800)
Neutrophils Relative %: 58.2 %
Platelets: 217 10*3/uL (ref 140–400)
RBC: 4.77 10*6/uL (ref 4.20–5.80)
RDW: 12.5 % (ref 11.0–15.0)
Total Lymphocyte: 29 %
WBC: 5.3 10*3/uL (ref 3.8–10.8)

## 2018-02-14 LAB — COMPLETE METABOLIC PANEL WITH GFR
AG RATIO: 1.7 (calc) (ref 1.0–2.5)
ALKALINE PHOSPHATASE (APISO): 50 U/L (ref 40–115)
ALT: 31 U/L (ref 9–46)
AST: 22 U/L (ref 10–40)
Albumin: 4.5 g/dL (ref 3.6–5.1)
BUN: 13 mg/dL (ref 7–25)
CO2: 32 mmol/L (ref 20–32)
Calcium: 9.1 mg/dL (ref 8.6–10.3)
Chloride: 102 mmol/L (ref 98–110)
Creat: 0.84 mg/dL (ref 0.60–1.35)
GFR, Est African American: 132 mL/min/{1.73_m2} (ref >=60)
GFR, Est Non African American: 114 mL/min/{1.73_m2} (ref >=60)
Globulin: 2.7 g/dL (calc) (ref 1.9–3.7)
Glucose, Bld: 141 mg/dL — ABNORMAL HIGH (ref 65–139)
POTASSIUM: 4.2 mmol/L (ref 3.5–5.3)
Sodium: 140 mmol/L (ref 135–146)
Total Bilirubin: 0.6 mg/dL (ref 0.2–1.2)
Total Protein: 7.2 g/dL (ref 6.1–8.1)

## 2018-02-16 NOTE — Telephone Encounter (Signed)
Glucose is 141.  All other labs are WNL.

## 2018-04-27 ENCOUNTER — Other Ambulatory Visit: Payer: Self-pay | Admitting: Rheumatology

## 2018-04-27 NOTE — Telephone Encounter (Signed)
Last Visit: 12/15/2017 Next Visit: 05/14/2018 Labs: 02/13/2018 Glucose is 141. All other labs are WNL.  TB Gold: 11/03/2017 negative   Okay to refill per Dr. Corliss Skains.

## 2018-05-07 NOTE — Progress Notes (Signed)
Virtual Visit via Video Note  I connected with Colin Jensen on 05/07/18 at  8:00 AM EDT by a video enabled telemedicine application and verified that I am speaking with the correct person using two identifiers.   I discussed the limitations of evaluation and management by telemedicine and the availability of in person appointments. The patient expressed understanding and agreed to proceed.  CC: Medication monitoring   History of Present Illness: Patient is a 33 year old male with a past medical history of psoriatic arthritis.  He is on Enbrel 50 mg sq weekly injections.  He has not missed any doses.  He has not had any recent infections.  He is tolerating it well without any side effects.  He denies any recent flares.  He has no joint pain or joint swelling at this time.  He has no achilles tendonitis or plantar fasciitis.  He has no SI joint pain. If he experiences any joint discomfort he uses voltaren gel topically but he has not needed to recently.  He has no morning stiffness.  He has been working on out on a regular basis. He has a few scattered patches of psoriasis on his abdomen and scalp.  He uses clobetasol foam on his scalp and cream on his abdomen.      Review of Systems  Constitutional: Negative for fever and malaise/fatigue.  Eyes: Negative for photophobia, pain, discharge and redness.  Respiratory: Negative for cough, shortness of breath and wheezing.   Cardiovascular: Negative for chest pain and palpitations.  Gastrointestinal: Negative for blood in stool, constipation and diarrhea.  Genitourinary: Negative for dysuria.  Musculoskeletal: Negative for back pain, joint pain, myalgias and neck pain.  Skin: Positive for rash (Psoriasis).  Neurological: Negative for dizziness and headaches.  Psychiatric/Behavioral: Negative for depression. The patient is not nervous/anxious and does not have insomnia.    Observations/Objective:  Physical Exam  Constitutional: He is oriented to  person, place, and time and well-developed, well-nourished, and in no distress.  HENT:  Head: Normocephalic and atraumatic.  Eyes: Conjunctivae are normal.  Pulmonary/Chest: Effort normal.  Neurological: He is alert and oriented to person, place, and time.  Psychiatric: Mood, memory, affect and judgment normal.   Complete fist formation bilaterally.   Patient reports morning stiffness for0  minutes.   Patient denies nocturnal pain.  Difficulty dressing/grooming: Denies Difficulty climbing stairs: Denies Difficulty getting out of chair: Denies Difficulty using hands for taps, buttons, cutlery, and/or writing: Denies   He has no new allergies.  No new medications.   No new medical diagnoses.   Assessment and Plan: Visit Diagnoses: Psoriatic arthritis (HCC) -He has not had any recent flares.  He has no joint pain or joint swelling at this time.  He does not experience any morning stiffness. He has no achilles tendonitis or plantar fasciitis.  He has no SI joint pain.  He is clinically doing well on Enbrel 50 mg sq injections once weekly.  He has no missed any doses recently.  He has not had any recent infections.  He has not experienced any side effects and is tolerating Enbrel.  We discussed switching him to Cosentyx at his last visit, and he is still interested in switching once things calm down with COVID-19.  He continues to have scattered patches of psoriasis on his abdomen and scalp, so he would like to try Cosentyx to see if it will help with skin clearance.  He has been using clobetasol topically PRN.  He is also  interested in switching due to possibly less long term side effects with cosentyx compared to Enbrel.  Enbrel has been effective for controlling his arthritis.  At his next visit in 3 months, we will discuss switching him to Cosentyx.  For now he will remain on Enbrel 50 mg sq injections weekly.  He does not need any refills at this time.  He was advised to notify us if he  develops increased joint pain or joint swelling.  He will follow up in 3 months.   Psoriasis: He has scattered patches of psoriasis on his abdomen and scalp.  He uses clobetasol foam on his scalp and cream on his abdomen.  He does not need any refills at this time.  In the future he is interested in switching to cosentyx to help with psoriasis skin clearance.   High risk medication use -Current regimen includes Enbrel 50 mg subq weekly.  TB gold negative on 11/03/17.  CBC and CMP were drawn on 02/13/18.  He is due for lab work this month.  Future orders for CBC and CMP were placed today.  He was advised to hold his dose of Enbrel anytime he has an infection.  He was encouraged to practice good hand hygiene and follow the recommendations made by the CDC in regards to COVID-19.    Follow Up Instructions: He will follow up in 3 months.    I discussed the assessment and treatment plan with the patient. The patient was provided an opportunity to ask questions and all were answered. The patient agreed with the plan and demonstrated an understanding of the instructions.   The patient was advised to call back or seek an in-person evaluation if the symptoms worsen or if the condition fails to improve as anticipated.  I provided 15 minutes of non-face-to-face time during this encounter.   Sherron Ales, PA-C

## 2018-05-14 ENCOUNTER — Other Ambulatory Visit: Payer: Self-pay

## 2018-05-14 ENCOUNTER — Telehealth: Payer: Self-pay | Admitting: Rheumatology

## 2018-05-14 ENCOUNTER — Telehealth (INDEPENDENT_AMBULATORY_CARE_PROVIDER_SITE_OTHER): Payer: BC Managed Care – PPO | Admitting: Physician Assistant

## 2018-05-14 ENCOUNTER — Encounter: Payer: Self-pay | Admitting: Physician Assistant

## 2018-05-14 DIAGNOSIS — L409 Psoriasis, unspecified: Secondary | ICD-10-CM

## 2018-05-14 DIAGNOSIS — L4050 Arthropathic psoriasis, unspecified: Secondary | ICD-10-CM

## 2018-05-14 DIAGNOSIS — Z79899 Other long term (current) drug therapy: Secondary | ICD-10-CM

## 2018-05-14 NOTE — Telephone Encounter (Signed)
Tried contacting patient in regards to scheduling his next 3 month follow up appt with Sherron Ales, PAC. There was no answer, and voicemail was full. I was unable to leave a message.

## 2018-05-14 NOTE — Telephone Encounter (Signed)
-----   Message from Ellen Henri, CMA sent at 05/14/2018 10:10 AM EDT ----- Patient had virtual visit today with Ladona Ridgel. Please call to schedule 3 month follow up. Thanks!

## 2018-05-18 ENCOUNTER — Telehealth: Payer: Self-pay | Admitting: Rheumatology

## 2018-05-18 NOTE — Telephone Encounter (Signed)
-----   Message from Marissa C Graves, CMA sent at 05/14/2018 10:10 AM EDT ----- °Patient had virtual visit today with Taylor. Please call to schedule 3 month follow up. Thanks!  ° °

## 2018-05-18 NOTE — Telephone Encounter (Signed)
LMOM for patient to call and schedule 3 month follow-up appointment. °

## 2018-07-17 ENCOUNTER — Telehealth: Payer: Self-pay | Admitting: Rheumatology

## 2018-07-17 ENCOUNTER — Other Ambulatory Visit: Payer: Self-pay | Admitting: *Deleted

## 2018-07-17 MED ORDER — PREDNISONE 5 MG PO TABS: ORAL_TABLET | ORAL | 1 refills | Status: DC

## 2018-07-17 NOTE — Telephone Encounter (Signed)
Patient called stating he is having a flair in his foot and is leaving to go out of town tomorrow 07/18/18.  Patient is requesting a prescription of Prednisone to be [facility] to [facility] [address] in [place] today.  Please advise.

## 2018-07-17 NOTE — Telephone Encounter (Signed)
Vincente Liberty spoke to patient. Prednisone sent, appt scheduled

## 2018-07-17 NOTE — Telephone Encounter (Signed)
Ok to prescribe prednisone taper starting at 20 mg and tapering by 5 mg every 4 days.    Please schedule follow up visit to discuss switching from enbrel to Cosentyx as discussed at his virtual visit in April.

## 2018-07-21 NOTE — Progress Notes (Signed)
Office Visit Note  Patient: Colin RegisterMichael Jensen             Date of Birth: 11/17/1985           MRN: [id number]             PCP: Wendall PapaHairfield, Keavie C Referring: Wendall PapaHairfield, Keavie C, NP Visit Date: 07/28/2018 Occupation: @GUAROCC @  Subjective:  Psoriasis and left foot pain.   History of Present Illness: Colin RegisterMichael Jensen is a 33 y.o. male with history of psoriatic arthritis and psoriasis.  He states about 3 weeks ago he had a flare with pain and discomfort in his left foot which he describes over the lateral aspect of the fifth metatarsal.  He was given a prednisone taper for about 6 days and the symptoms resolved.  He states he had similar flare in the October 2019.  He usually has about 2 flares a year.  In between flares he does not have any symptoms.  Continues to have some psoriasis patches on his trunk and his scalp area.  Activities of Daily Living:  Patient reports morning stiffness for 5 minutes.   Patient Denies nocturnal pain.  Difficulty dressing/grooming: Denies Difficulty climbing stairs: Denies Difficulty getting out of chair: Denies Difficulty using hands for taps, buttons, cutlery, and/or writing: Denies  Review of Systems  Constitutional: Negative for fatigue.  HENT: Negative for mouth sores, mouth dryness and nose dryness.   Eyes: Negative for itching and dryness.  Respiratory: Negative for shortness of breath, wheezing and difficulty breathing.   Cardiovascular: Negative for chest pain and swelling in legs/feet.  Gastrointestinal: Negative for blood in stool, constipation and diarrhea.  Endocrine: Negative for increased urination.  Genitourinary: Negative for painful urination and pelvic pain.  Musculoskeletal: Positive for arthralgias, joint pain and morning stiffness. Negative for joint swelling.  Skin: Positive for rash.  Allergic/Immunologic: Negative for susceptible to infections.  Neurological: Negative for dizziness, headaches, memory loss and weakness.    Hematological: Negative for swollen glands.  Psychiatric/Behavioral: Negative for confusion. The patient is not nervous/anxious.     PMFS History:  Patient Active Problem List   Diagnosis Date Noted   History of obesity 05/01/2016   Psoriatic arthritis (HCC) 12/06/2015   Psoriasis 12/06/2015   High risk medication use 12/06/2015    Past Medical History:  Diagnosis Date   Psoriatic arthritis (HCC)     Family History  Problem Relation Age of Onset   Heart attack Father    Psoriasis Father    Diabetes Sister    Healthy Son    History reviewed. No pertinent surgical history. Social History   Social History Narrative   Not on file   Immunization History  Administered Date(s) Administered   Influenza Inj Mdck Quad With Preservative 01/23/2018   Tdap 01/23/2018     Objective: Vital Signs: BP 126/79 (BP Location: Left Arm, Patient Position: Sitting, Cuff Size: Normal)    Pulse 82    Resp 13    Ht 5\' 11"  (1.803 m)    Wt 257 lb 12.8 oz (116.9 kg)    BMI 35.96 kg/m    Physical Exam Vitals signs and nursing note reviewed.  Constitutional:      Appearance: He is well-developed.  HENT:     Head: Normocephalic and atraumatic.  Eyes:     Conjunctiva/sclera: Conjunctivae normal.     Pupils: Pupils are equal, round, and reactive to light.  Neck:     Musculoskeletal: Normal range of motion and neck supple.  Cardiovascular:     Rate and Rhythm: Normal rate and regular rhythm.     Heart sounds: Normal heart sounds.  Pulmonary:     Effort: Pulmonary effort is normal.     Breath sounds: Normal breath sounds.  Abdominal:     General: Bowel sounds are normal.     Palpations: Abdomen is soft.  Skin:    General: Skin is warm and dry.     Capillary Refill: Capillary refill takes less than 2 seconds.     Comments: Psoriasis patches on abdominal area, bilateral knees and the scalp.  Nail pitting was noted.  Neurological:     Mental Status: He is alert and oriented to  person, place, and time.  Psychiatric:        Behavior: Behavior normal.      Musculoskeletal Exam: C-spine thoracic and lumbar spine good range of motion.  Shoulder joints elbow joints wrist joints MCPs PIPs been good range of motion.  Hip joints knee joints ankles MTPs PIPs with good range of motion.  He had what mild tenderness over the left fifth metatarsal base.  CDAI Exam: CDAI Score: 0.4  Patient Global: 2 mm; Provider Global: 2 mm Swollen: 0 ; Tender: 0  Joint Exam   No joint exam has been documented for this visit   There is currently no information documented on the homunculus. Go to the Rheumatology activity and complete the homunculus joint exam.  Investigation: No additional findings.  Imaging: No results found.  Recent Labs: Lab Results  Component Value Date   WBC 5.3 02/13/2018   HGB 15.0 02/13/2018   PLT 217 02/13/2018   NA 140 02/13/2018   K 4.2 02/13/2018   CL 102 02/13/2018   CO2 32 02/13/2018   GLUCOSE 141 (H) 02/13/2018   BUN 13 02/13/2018   CREATININE 0.84 02/13/2018   BILITOT 0.6 02/13/2018   ALKPHOS 46 05/07/2016   AST 22 02/13/2018   ALT 31 02/13/2018   PROT 7.2 02/13/2018   ALBUMIN 4.2 05/07/2016   CALCIUM 9.1 02/13/2018   GFRAA 132 02/13/2018   QFTBGOLDPLUS NEGATIVE 11/03/2017    Speciality Comments: No specialty comments available.  Procedures:  No procedures performed Allergies: Nickel, Ibuprofen, and Penicillins   Assessment / Plan:     Visit Diagnoses: Psoriatic arthritis ([place]) -patient is having infrequent flares.  His last flare was about 3 weeks ago.  He has had about 2-3 flares a year.  He denies any joint pain or discomfort today.  He has been on Enbrel for a while.  I discussed different treatment options with him.  After discussing indications side effects contraindications we decided to proceed with Cosentyx.  Indication side effects contraindications were discussed at length.  Handout was obtained.  The Cosentyx has been  approved.  He will stop Enbrel 1 week prior to the first dose of Cosentyx.  Psoriasis -he still has psoriasis rash on his scalp, trunk and his extremities.  He has nail pitting as well.  I believe his psoriasis will be better controlled with Cosentyx.  High risk medication use - Enbrel SureClick 50 mg every 7 days.  Last TB gold negative on 11/03/2017 and will monitor yearly.  Most recent CBC/CMP within normal limits on 02/13/2018.  He is overdue for labs.  CBC/CMP ordered for today and will monitor every 3 months.  Standing orders are in place.  He received the flu vaccine in December.  Recommend Prevnar 13 and Pneumovax 23 as indicated.  Recommend yearly  skin exams. - Plan: CBC with Differential/Platelet, COMPLETE METABOLIC PANEL WITH GFR, HIV Antibody (routine testing w rflx), Serum protein electrophoresis with reflex, IgG, IgA, IgM,   Pain in left foot-he had recent flare with tenderness over base of fifth metatarsal in his left foot.  There was no synovitis on examination today.  He had recent prednisone taper which helped.   History of obesity - Plan: Weight loss diet and exercise was discussed.  He is very active.   Orders: Orders Placed This Encounter  Procedures   CBC with Differential/Platelet   COMPLETE METABOLIC PANEL WITH GFR   HIV Antibody (routine testing w rflx)   Serum protein electrophoresis with reflex   IgG, IgA, IgM   No orders of the defined types were placed in this encounter.   Face-to-face time spent with patient was 30 minutes. Greater than 50% of time was spent in counseling and coordination of care.  Follow-Up Instructions: Return in about 3 months (around 10/28/2018) for Psoriatic arthritis.   Pollyann SavoyShaili Devlynn Knoff, MD  Note - This record has been created using [employer] software.  Chart creation errors have been sought, but may not always  have been located. Such creation errors do not reflect on  the standard of medical care.

## 2018-07-24 ENCOUNTER — Telehealth: Payer: Self-pay | Admitting: Pharmacist

## 2018-07-24 NOTE — Telephone Encounter (Signed)
Plan to consent patient on Cosentyx at next visit. Prior Authorization request sent to [place] for [place]. Authorization has been submitted to patient's insurance via Cover My Meds. Will update once we receive a response.  PA Case ID: [id number]

## 2018-07-24 NOTE — Telephone Encounter (Signed)
Received a fax from [place] regarding a prior authorization for [place]. Authorization has been APPROVED from 07/24/18 to 07/24/19.   Will send document to scan center.  Authorization # [id number] Phone # [phone]  Per plan, Patient must fill through [facility].  1:04 PM Beatriz Chancellor, CPhT

## 2018-07-24 NOTE — Progress Notes (Signed)
Pharmacy Note  Subjective:  Patient presents today to the [facility] to see Dr. Estanislado Pandy.  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  Prior therapy includes Enbrel and methotrexate (inadequate response).  Objective:  CBC    Component Value Date/Time   WBC 5.3 02/13/2018 1559   RBC 4.77 02/13/2018 1559   HGB 15.0 02/13/2018 1559   HCT 42.5 02/13/2018 1559   PLT 217 02/13/2018 1559   MCV 89.1 02/13/2018 1559   MCH 31.4 02/13/2018 1559   MCHC 35.3 02/13/2018 1559   RDW 12.5 02/13/2018 1559   LYMPHSABS 1,537 02/13/2018 1559   MONOABS 520 05/07/2016 0850   EOSABS 170 02/13/2018 1559   BASOSABS 80 02/13/2018 1559    CMP     Component Value Date/Time   NA 140 02/13/2018 1559   K 4.2 02/13/2018 1559   CL 102 02/13/2018 1559   CO2 32 02/13/2018 1559   GLUCOSE 141 (H) 02/13/2018 1559   BUN 13 02/13/2018 1559   CREATININE 0.84 02/13/2018 1559   CALCIUM 9.1 02/13/2018 1559   PROT 7.2 02/13/2018 1559   ALBUMIN 4.2 05/07/2016 0850   AST 22 02/13/2018 1559   ALT 31 02/13/2018 1559   ALKPHOS 46 05/07/2016 0850   BILITOT 0.6 02/13/2018 1559   GFRNONAA 114 02/13/2018 1559   GFRAA 132 02/13/2018 1559    Baseline Immunosuppressant Therapy Labs TB GOLD Quantiferon TB Gold Latest Ref Rng & Units 11/03/2017  Quantiferon TB Gold Plus NEGATIVE NEGATIVE   Hepatitis Panel: negative 05/18/2008  HIV: pending 07/24/2018  Immunoglobulins:pending 07/24/2018  SPEP: pending 07/24/2018  G6PD No results found for: G6PDH TPMT No results found for: TPMT   Chest x-ray: normal April 2010  Does patient have a history of inflammatory bowel disease? Not that he knows of.    His grandmother had a GI condition and he plans to follow up with family.  Instructed patient call our office to discuss other treatment options if there is a positive family history of IBD.  Assessment/Plan:  Counseled patient that  Cosentyx is a IL-17 inhibitor that works to reduce pain and inflammation associated with arthritis.  Counseled patient on purpose, proper use, and adverse effects of Cosentyx.  Reviewed the most common adverse effects of infection, inflammatory bowel disease, and allergic reaction.  Counseled patient that Cosentyx should be held prior to scheduled surgery.  Counseled patient to avoid live vaccines while on Cosentyx.  Recommend annual influenza, Pneumovax 23, Prevnar 13, and Shingrix as indicated.   Reviewed the importance of regular labs while on Cosentyx. Standing orders placed. Provided patient with medication education material and answered all questions.  Patient consented to Cosentyx.  Will upload consent into patient's chart.  Reviewed storage information for Cosentyx.  Advised initial injection must be administered in office.  Patient voiced understanding.    He has already been approved for Cosentyx through insurance and must fill [facility].  Patient dose will be 300 mg every 7 days for 5 weeks and then 300 mg every 28 days .  Prescription and new start visit pending labs.  He asked if he should take his Enbrel dose today. Instructed patient to take his Enbrel dose today and that we will have to wait a week till we start Cosentyx.  Patient verbalized understanding.  All questions encouraged and answered.  Instructed patient to call with any further questions or concerns.  Mariella Saa, PharmD, [facility] Rheumatology Clinical  Pharmacist  07/28/2018 12:13 PM

## 2018-07-28 ENCOUNTER — Other Ambulatory Visit: Payer: Self-pay

## 2018-07-28 ENCOUNTER — Ambulatory Visit (INDEPENDENT_AMBULATORY_CARE_PROVIDER_SITE_OTHER): Payer: BC Managed Care – PPO | Admitting: Rheumatology

## 2018-07-28 ENCOUNTER — Encounter: Payer: Self-pay | Admitting: Rheumatology

## 2018-07-28 VITALS — BP 126/79 | HR 82 | Resp 13 | Ht 71.0 in | Wt 257.8 lb

## 2018-07-28 DIAGNOSIS — L4050 Arthropathic psoriasis, unspecified: Secondary | ICD-10-CM | POA: Diagnosis not present

## 2018-07-28 DIAGNOSIS — Z8639 Personal history of other endocrine, nutritional and metabolic disease: Secondary | ICD-10-CM | POA: Diagnosis not present

## 2018-07-28 DIAGNOSIS — L409 Psoriasis, unspecified: Secondary | ICD-10-CM

## 2018-07-28 DIAGNOSIS — M79672 Pain in left foot: Secondary | ICD-10-CM

## 2018-07-28 DIAGNOSIS — Z79899 Other long term (current) drug therapy: Secondary | ICD-10-CM

## 2018-07-28 NOTE — Patient Instructions (Signed)
Standing Labs We placed an order today for your standing lab work.    Please come back and get your standing labs in 1 month and then every 3 months.  We have open lab daily Monday through Thursday from 8:30-12:30 PM and 1:30-4:30 PM and Friday from 8:30-12:30 PM and 1:30 -4:00 PM at the office of Dr. Bo Merino.   You may experience shorter wait times on Monday and Friday afternoons. The office is located at [address] [place], [place], [place] [address] No appointment is necessary.   Labs are drawn by [facility].  You may receive a bill from [facility] for your lab work.  If you wish to have your labs drawn at another location, please call the office 24 hours in advance to send orders.  If you have any questions regarding directions or hours of operation,  please call [phone].   Just as a reminder please drink plenty of water prior to coming for your lab work. Thanks!  Vaccines You are taking a medication(s) that can suppress your immune system.  The following immunizations are recommended: . Flu annually . Pneumonia (Pneumovax 23 and Prevnar 13 spaced at least 1 year apart) . Shingrix  Please check with your PCP to make sure you are up to date.  Secukinumab injection What is this medicine? SECUKINUMAB (sek ue KIN ue mab) is used to treat psoriasis. It is also used to treat psoriatic arthritis and ankylosing spondylitis. This medicine may be used for other purposes; ask your health care provider or pharmacist if you have questions. COMMON BRAND NAME(S): Cosentyx What should I tell my health care provider before I take this medicine? They need to know if you have any of these conditions: -Crohn's disease, ulcerative colitis, or other inflammatory bowel disease -infection or history of infection -other conditions affecting the immune system -recently received or are scheduled to receive a vaccine -tuberculosis, a positive skin test for tuberculosis, or have recently  been in close contact with someone who has tuberculosis -an unusual or allergic reaction to secukinumab, other medicines, latex, rubber, foods, dyes, or preservatives -pregnant or trying to get pregnant -breast-feeding How should I use this medicine? This medicine is for injection under the skin. It may be administered by a healthcare professional in a hospital or clinic setting or at home. If you get this medicine at home, you will be taught how to prepare and give this medicine. Use exactly as directed. Take your medicine at regular intervals. Do not take your medicine more often than directed. It is important that you put your used needles and syringes in a special sharps container. Do not put them in a trash can. If you do not have a sharps container, call your pharmacist or healthcare provider to get one. A special MedGuide will be given to you by the pharmacist with each prescription and refill. Be sure to read this information carefully each time. Talk to your pediatrician regarding the use of this medicine in children. Special care may be needed. Overdosage: If you think you have taken too much of this medicine contact a poison control center or emergency room at once. NOTE: This medicine is only for you. Do not share this medicine with others. What if I miss a dose? It is important not to miss your dose. Call your doctor of health care professional if you are unable to keep an appointment. If you give yourself the medicine and you miss a dose, take it as soon as you  can. If it is almost time for your next dose, take only that dose. Do not take double or extra doses. What may interact with this medicine? Do not take this medicine with any of the following medications: -live virus vaccines This medicine may also interact with the following medications: -cyclosporine -inactivated vaccines -warfarin This list may not describe all possible interactions. Give your health care provider a list  of all the medicines, herbs, non-prescription drugs, or dietary supplements you use. Also tell them if you smoke, drink alcohol, or use illegal drugs. Some items may interact with your medicine. What should I watch for while using this medicine? Tell your doctor or healthcare professional if your symptoms do not start to get better or if they get worse. You will be tested for tuberculosis (TB) before you start this medicine. If your doctor prescribes any medicine for TB, you should start taking the TB medicine before starting this medicine. Make sure to finish the full course of TB medicine. Call your doctor or healthcare professional for advice if you get a fever, chills or sore throat, or other symptoms of a cold or flu. Do not treat yourself. This drug decreases your body's ability to fight infections. Try to avoid being around people who are sick. This medicine can decrease the response to a vaccine. If you need to get vaccinated, tell your healthcare professional if you have received this medicine within the last 6 months. Extra booster doses may be needed. Talk to your doctor to see if a different vaccination schedule is needed. What side effects may I notice from receiving this medicine? Side effects that you should report to your doctor or health care professional as soon as possible: -allergic reactions like skin rash, itching or hives, swelling of the face, lips, or tongue -signs and symptoms of infection like fever or chills; cough; sore throat; pain or trouble passing urine Side effects that usually do not require medical attention (report to your doctor or health care professional if they continue or are bothersome): -diarrhea This list may not describe all possible side effects. Call your doctor for medical advice about side effects. You may report side effects to FDA at 1-800-FDA-1088. Where should I keep my medicine? Keep out of the reach of children. Store the prefilled syringe or  injection pen in a refrigerator between 2 to 8 degrees C (36 to 46 degrees F). Keep the syringe or the pen in the original carton until ready for use. Protect from light. Do not freeze. Do not shake. Prior to use, remove the syringe or pen from the refrigerator and use within 1 hour. Throw away any unused medicine after the expiration date on the label. NOTE: This sheet is a summary. It may not cover all possible information. If you have questions about this medicine, talk to your doctor, pharmacist, or health care provider.  2019 Elsevier/Gold Standard (2015-02-23 11:48:31)

## 2018-07-30 LAB — COMPLETE METABOLIC PANEL WITH GFR
AG Ratio: 1.9 (calc) (ref 1.0–2.5)
ALT: 17 U/L (ref 9–46)
AST: 19 U/L (ref 10–40)
Albumin: 4.5 g/dL (ref 3.6–5.1)
Alkaline phosphatase (APISO): 45 U/L (ref 36–130)
BUN: 11 mg/dL (ref 7–25)
CO2: 29 mmol/L (ref 20–32)
Calcium: 9.7 mg/dL (ref 8.6–10.3)
Chloride: 105 mmol/L (ref 98–110)
Creat: 0.85 mg/dL (ref 0.60–1.35)
GFR, Est African American: 132 mL/min/{1.73_m2} (ref >=60)
GFR, Est Non African American: 114 mL/min/{1.73_m2} (ref >=60)
Globulin: 2.4 g/dL (calc) (ref 1.9–3.7)
Glucose, Bld: 90 mg/dL (ref 65–99)
Potassium: 4.1 mmol/L (ref 3.5–5.3)
Sodium: 141 mmol/L (ref 135–146)
Total Bilirubin: 0.8 mg/dL (ref 0.2–1.2)
Total Protein: 6.9 g/dL (ref 6.1–8.1)

## 2018-07-30 LAB — CBC WITH DIFFERENTIAL/PLATELET
Absolute Monocytes: 481 cells/uL (ref 200–950)
Basophils Absolute: 92 cells/uL (ref 0–200)
Basophils Relative: 1.7 %
Eosinophils Absolute: 92 cells/uL (ref 15–500)
Eosinophils Relative: 1.7 %
HCT: 45.9 % (ref 38.5–50.0)
Hemoglobin: 15.8 g/dL (ref 13.2–17.1)
Lymphs Abs: 1366 cells/uL (ref 850–3900)
MCH: 31.7 pg (ref 27.0–33.0)
MCHC: 34.4 g/dL (ref 32.0–36.0)
MCV: 92.2 fL (ref 80.0–100.0)
MPV: 11.2 fL (ref 7.5–12.5)
Monocytes Relative: 8.9 %
Neutro Abs: 3370 cells/uL (ref 1500–7800)
Neutrophils Relative %: 62.4 %
Platelets: 188 10*3/uL (ref 140–400)
RBC: 4.98 10*6/uL (ref 4.20–5.80)
RDW: 12.8 % (ref 11.0–15.0)
Total Lymphocyte: 25.3 %
WBC: 5.4 10*3/uL (ref 3.8–10.8)

## 2018-07-30 LAB — PROTEIN ELECTROPHORESIS, SERUM, WITH REFLEX
Albumin ELP: 4.4 g/dL (ref 3.8–4.8)
Alpha 1: 0.2 g/dL (ref 0.2–0.3)
Alpha 2: 0.4 g/dL — ABNORMAL LOW (ref 0.5–0.9)
Beta 2: 0.3 g/dL (ref 0.2–0.5)
Beta Globulin: 0.4 g/dL (ref 0.4–0.6)
Gamma Globulin: 1.2 g/dL (ref 0.8–1.7)
Total Protein: 7 g/dL (ref 6.1–8.1)

## 2018-07-30 LAB — IGG, IGA, IGM
IgG (Immunoglobin G), Serum: 1318 mg/dL (ref 600–1640)
IgM, Serum: 76 mg/dL (ref 50–300)
Immunoglobulin A: 210 mg/dL (ref 47–310)

## 2018-07-30 LAB — HIV ANTIBODY (ROUTINE TESTING W REFLEX): HIV 1&2 Ab, 4th Generation: NONREACTIVE

## 2018-08-03 NOTE — Progress Notes (Signed)
All the labs are available now.  It is okay to proceed with Cosentyx.

## 2018-08-03 NOTE — Progress Notes (Signed)
Notified patient of normal results. Last Enbrel injection was on 6/24. New start appointment scheduled on 7/2 at [facility].

## 2018-08-04 NOTE — Progress Notes (Signed)
Pharmacy Note  Subjective:   Patient is being initiated on Cosentyx .  Patient was previously counseled extensively on and consented to initiation of Cosentyx at that time. Patient presents to clinic today to receive the first dose of Cosentyx.     Objective: CMP     Component Value Date/Time   NA 141 07/28/2018 1221   K 4.1 07/28/2018 1221   CL 105 07/28/2018 1221   CO2 29 07/28/2018 1221   GLUCOSE 90 07/28/2018 1221   BUN 11 07/28/2018 1221   CREATININE 0.85 07/28/2018 1221   CALCIUM 9.7 07/28/2018 1221   PROT 6.9 07/28/2018 1221   PROT 7.0 07/28/2018 1221   ALBUMIN 4.2 05/07/2016 0850   AST 19 07/28/2018 1221   ALT 17 07/28/2018 1221   ALKPHOS 46 05/07/2016 0850   BILITOT 0.8 07/28/2018 1221   GFRNONAA 114 07/28/2018 1221   GFRAA 132 07/28/2018 1221    CBC    Component Value Date/Time   WBC 5.4 07/28/2018 1221   RBC 4.98 07/28/2018 1221   HGB 15.8 07/28/2018 1221   HCT 45.9 07/28/2018 1221   PLT 188 07/28/2018 1221   MCV 92.2 07/28/2018 1221   MCH 31.7 07/28/2018 1221   MCHC 34.4 07/28/2018 1221   RDW 12.8 07/28/2018 1221   LYMPHSABS 1,366 07/28/2018 1221   MONOABS 520 05/07/2016 0850   EOSABS 92 07/28/2018 1221   BASOSABS 92 07/28/2018 1221    Baseline Immunosuppressant Therapy Labs TB GOLD Quantiferon TB Gold Latest Ref Rng & Units 11/03/2017  Quantiferon TB Gold Plus NEGATIVE NEGATIVE   Hepatitis Panel: negative 05/18/2008  HIV Lab Results  Component Value Date   HIV NON-REACTIVE 07/28/2018   Immunoglobulins Immunoglobulin Electrophoresis Latest Ref Rng & Units 07/28/2018  IgA  47 - 310 mg/dL 210  IgG 600 - 1,640 mg/dL 1,318  IgM 50 - 300 mg/dL 76   SPEP Serum Protein Electrophoresis Latest Ref Rng & Units 07/28/2018  Total Protein 6.1 - 8.1 g/dL 7.0  Albumin 3.8 - 4.8 g/dL 4.4  Alpha-1 0.2 - 0.3 g/dL 0.2  Alpha-2 0.5 - 0.9 g/dL 0.4(L)  Beta Globulin 0.4 - 0.6 g/dL 0.4  Beta 2 0.2 - 0.5 g/dL 0.3  Gamma Globulin 0.8 - 1.7 g/dL 1.2   G6PD No  results found for: G6PDH TPMT No results found for: TPMT   Chest x-ray: normal April 2010  Patient running a fever or have signs/symptoms of infection? No  Assessment/Plan:  Demonstrated proper injection technique with Cosentyx demo pen.  Patient able to demonstrate proper injection technique using the teach back method.  Patient self injected in the right anterior thigh with:  Sample Medication: Cosentyx 150 mg/ml 2 Sensoready Pens NDC: [phone] Lot: [id number] Expiration: 09/2019  Patient tolerated well.  Observed for 30 mins in office for adverse reaction and none noted.   Patient is to return in 1 month for labs.  Standing orders placed. [facility]cription [facility] as required through insurance.  He was given a co-pay card to assist with coverage.  He was also given a sample with the same lot/expiration as above in case of any shipment delays due to the holiday.  He is to return for labs in 1 month to monitor for drug toxicity.  Standing orders are in place.  A prednisone taper was also [facility].  All questions encouraged and answered.  Instructed patient to call with any further questions or concerns.  Mariella Saa, PharmD, [facility] Rheumatology  Clinical Pharmacist  08/06/2018 9:31 AM

## 2018-08-06 ENCOUNTER — Ambulatory Visit (INDEPENDENT_AMBULATORY_CARE_PROVIDER_SITE_OTHER): Payer: BC Managed Care – PPO | Admitting: Pharmacist

## 2018-08-06 ENCOUNTER — Other Ambulatory Visit: Payer: Self-pay

## 2018-08-06 VITALS — BP 106/67 | HR 83

## 2018-08-06 DIAGNOSIS — L4050 Arthropathic psoriasis, unspecified: Secondary | ICD-10-CM | POA: Diagnosis not present

## 2018-08-06 DIAGNOSIS — L409 Psoriasis, unspecified: Secondary | ICD-10-CM | POA: Diagnosis not present

## 2018-08-06 MED ORDER — COSENTYX SENSOREADY (300 MG) 150 MG/ML ~~LOC~~ SOAJ: 300.0000 mg | SUBCUTANEOUS | 0 refills | Status: DC

## 2018-08-06 MED ORDER — PREDNISONE 5 MG PO TABS: ORAL_TABLET | ORAL | 0 refills | Status: AC

## 2018-08-06 MED ORDER — COSENTYX SENSOREADY (300 MG) 150 MG/ML ~~LOC~~ SOAJ: 300.0000 mg | SUBCUTANEOUS | 0 refills | Status: AC

## 2018-08-06 NOTE — Addendum Note (Signed)
Addended by: Cala Kruckenberg C on: 08/06/2018 12:27 PM   Modules accepted: Level of Service  

## 2018-08-06 NOTE — Patient Instructions (Addendum)
Cosentyx Dosing: Inject 300 mg (2 pens) every 7 days for 5 weeks then 300 mg (2 pens) every 28 days  Schedule: 7/2, 7/9, 7/16, 7/23, 7/30, then 8/20  Standing Labs We placed an order today for your standing lab work.    Please come back and get your standing labs in 1 month and then every 3 months.  We have open lab daily Monday through Thursday from 8:30-12:30 PM and 1:30-4:30 PM and Friday from 8:30-12:30 PM and 1:30 -4:00 PM at the office of Dr. Bo Merino.   You may experience shorter wait times on Monday and Friday afternoons. The office is located at [address] [place], [place], [place] [address] No appointment is necessary.   Labs are drawn by [facility].  You may receive a bill from [facility] for your lab work.  If you wish to have your labs drawn at another location, please call the office 24 hours in advance to send orders.  If you have any questions regarding directions or hours of operation,  please call [phone].   Just as a reminder please drink plenty of water prior to coming for your lab work. Thanks!  Vaccines You are taking a medication(s) that can suppress your immune system.  The following immunizations are recommended: . Flu annually . Pneumonia (Pneumovax 23 and Prevnar 13 spaced at least 1 year apart) . Shingrix  Please check with your PCP to make sure you are up to date.  Helpful Tips for Injecting To help alleviate pain and injection site reactions consider the following tips:  . Placing something cold (like and ice gel pack or cold water bottle) on the injection site just before cleansing with alcohol may help reduce pain . If you have a localized reaction (redness, mild swelling, warmth, and itching) you can use topical corticosteroids (hydrocortisone cream) or antihistamine (Claritin) to help minimize reaction the day of, the day before, and the day after injecting . Always inject this medication at room temperature (remove from refrigerator  15-20 minutes before injecting; this may help eliminate stinging). . Always rotate your injection sites using inside/outside thigh of both legs, abdomen divided into 4 quadrants (stay away from waist line, and 2" away from navel). . Do not inject into areas where skin is tender, bruised, red, or hard or where there are scars or stretch marks. . Always let alcohol dry on the skin before injecting. . Place a cold, damp towel or small ice pack on the injection site for 10 or 15 minutes every 1 to 2 hours if it hurts or is swollen.

## 2018-08-24 ENCOUNTER — Other Ambulatory Visit: Payer: Self-pay | Admitting: Rheumatology

## 2018-08-24 DIAGNOSIS — L4050 Arthropathic psoriasis, unspecified: Secondary | ICD-10-CM

## 2018-08-24 DIAGNOSIS — L409 Psoriasis, unspecified: Secondary | ICD-10-CM

## 2018-09-07 ENCOUNTER — Encounter: Payer: Self-pay | Admitting: Rheumatology

## 2018-09-07 ENCOUNTER — Other Ambulatory Visit: Payer: Self-pay

## 2018-09-07 ENCOUNTER — Telehealth: Payer: Self-pay | Admitting: Pharmacist

## 2018-09-07 ENCOUNTER — Ambulatory Visit: Payer: BC Managed Care – PPO | Admitting: Rheumatology

## 2018-09-07 ENCOUNTER — Ambulatory Visit: Payer: Self-pay

## 2018-09-07 VITALS — BP 112/64 | HR 89 | Resp 14 | Ht 71.0 in | Wt 262.4 lb

## 2018-09-07 DIAGNOSIS — M25571 Pain in right ankle and joints of right foot: Secondary | ICD-10-CM | POA: Diagnosis not present

## 2018-09-07 DIAGNOSIS — L409 Psoriasis, unspecified: Secondary | ICD-10-CM

## 2018-09-07 DIAGNOSIS — L4050 Arthropathic psoriasis, unspecified: Secondary | ICD-10-CM | POA: Diagnosis not present

## 2018-09-07 DIAGNOSIS — Z79899 Other long term (current) drug therapy: Secondary | ICD-10-CM

## 2018-09-07 MED ORDER — LIDOCAINE HCL 1 % IJ SOLN
1.0000 mL | INTRAMUSCULAR | Status: AC | PRN
  Administered 2018-09-07: 1 mL

## 2018-09-07 MED ORDER — TRIAMCINOLONE ACETONIDE 40 MG/ML IJ SUSP
30.0000 mg | INTRAMUSCULAR | Status: AC | PRN
  Administered 2018-09-07: 30 mg via INTRA_ARTICULAR

## 2018-09-07 NOTE — Progress Notes (Signed)
Office Visit Note  Patient: Colin RegisterMichael Goldsborough             Date of Birth: 10/01/1985           MRN: [id number]             PCP: Wendall PapaHairfield, Keavie C Referring: Wendall PapaHairfield, Keavie C, NP Visit Date: 09/07/2018 Occupation: @GUAROCC @  Subjective:  Right ankle joint pain   History of Present Illness: Colin Jensen is a 33 y.o. male with history of psoriatic arthritis.  He just completed the loading dose of Cosentyx.  He will start injecting Cosentyx 300 mg sq every 28 days. He states his psoriasis has cleared since starting on Cosentyx.  He presents today with right ankle joint pain and swelling that started on Saturday. He has been seeing a chiropractor and has been tapping his right ankle. He denies any other joint pain or joint swelling.  He has no achilles tendonitis or plantar fasciitis.  He has no SI joint pain.    Activities of Daily Living:  Patient reports morning stiffness for 5 minutes.   Patient Reports nocturnal pain.  Difficulty dressing/grooming: Denies Difficulty climbing stairs: Reports Difficulty getting out of chair: Reports Difficulty using hands for taps, buttons, cutlery, and/or writing: Denies  Review of Systems  Constitutional: Positive for fatigue. Negative for night sweats.  HENT: Negative for mouth sores, mouth dryness and nose dryness.   Eyes: Negative for redness and dryness.  Respiratory: Negative for cough, shortness of breath and difficulty breathing.   Cardiovascular: Negative for chest pain, palpitations, hypertension, irregular heartbeat and swelling in legs/feet.  Gastrointestinal: Negative for blood in stool, constipation and diarrhea.  Endocrine: Negative for increased urination.  Genitourinary: Negative for painful urination.  Musculoskeletal: Positive for arthralgias, joint pain, joint swelling and morning stiffness. Negative for myalgias, muscle weakness, muscle tenderness and myalgias.  Skin: Negative for color change, rash, hair loss, nodules/bumps,  skin tightness, ulcers and sensitivity to sunlight.  Allergic/Immunologic: Negative for susceptible to infections.  Neurological: Negative for dizziness, fainting, memory loss, night sweats and weakness.  Hematological: Negative for swollen glands.  Psychiatric/Behavioral: Positive for sleep disturbance. Negative for depressed mood. The patient is not nervous/anxious.     PMFS History:  Patient Active Problem List   Diagnosis Date Noted  . History of obesity 05/01/2016  . Psoriatic arthritis (HCC) 12/06/2015  . Psoriasis 12/06/2015  . High risk medication use 12/06/2015    Past Medical History:  Diagnosis Date  . Psoriatic arthritis (HCC)     Family History  Problem Relation Age of Onset  . Heart attack Father   . Psoriasis Father   . Diabetes Sister   . Healthy Son    History reviewed. No pertinent surgical history. Social History   Social History Narrative  . Not on file   Immunization History  Administered Date(s) Administered  . Influenza Inj Mdck Quad With Preservative 01/23/2018  . Tdap 01/23/2018     Objective: Vital Signs: BP 112/64 (BP Location: Left Arm, Patient Position: Sitting, Cuff Size: Normal)   Pulse 89   Resp 14   Ht 5\' 11"  (1.803 m)   Wt 262 lb 6.4 oz (119 kg)   BMI 36.60 kg/m    Physical Exam Vitals signs and nursing note reviewed.  Constitutional:      Appearance: He is well-developed.  HENT:     Head: Normocephalic and atraumatic.  Eyes:     Conjunctiva/sclera: Conjunctivae normal.     Pupils: Pupils are equal, round,  and reactive to light.  Neck:     Musculoskeletal: Normal range of motion and neck supple.  Cardiovascular:     Rate and Rhythm: Normal rate and regular rhythm.     Heart sounds: Normal heart sounds.  Pulmonary:     Effort: Pulmonary effort is normal.     Breath sounds: Normal breath sounds.  Abdominal:     General: Bowel sounds are normal.     Palpations: Abdomen is soft.  Skin:    General: Skin is warm and dry.      Capillary Refill: Capillary refill takes less than 2 seconds.  Neurological:     Mental Status: He is alert and oriented to person, place, and time.  Psychiatric:        Behavior: Behavior normal.      Musculoskeletal Exam: C-spine, thoracic spine, and lumbar spine good ROM.  No midline spinal tenderness.  No SI joint tenderness.  Shoulder joints, elbow joints, wrist joints, MCPs, PIPs, and DIPs good ROM with no synovitis.  Hip joints and knee joints have good ROM with no discomfort.  No warmth or effusion of knee joints.  Left ankle joint has good ROM with no tenderness or inflammation. Right ankle joint tenderness.  Discomfort with inversion and plantarflexion of the right foot. No achilles tendonitis or plantar fasciitis.    CDAI Exam: CDAI Score: - Patient Global: -; Provider Global: - Swollen: -; Tender: - Joint Exam   No joint exam has been documented for this visit   There is currently no information documented on the homunculus. Go to the Rheumatology activity and complete the homunculus joint exam.  Investigation: No additional findings.  Imaging: No results found.  Recent Labs: Lab Results  Component Value Date   WBC 5.4 07/28/2018   HGB 15.8 07/28/2018   PLT 188 07/28/2018   NA 141 07/28/2018   K 4.1 07/28/2018   CL 105 07/28/2018   CO2 29 07/28/2018   GLUCOSE 90 07/28/2018   BUN 11 07/28/2018   CREATININE 0.85 07/28/2018   BILITOT 0.8 07/28/2018   ALKPHOS 46 05/07/2016   AST 19 07/28/2018   ALT 17 07/28/2018   PROT 6.9 07/28/2018   PROT 7.0 07/28/2018   ALBUMIN 4.2 05/07/2016   CALCIUM 9.7 07/28/2018   GFRAA 132 07/28/2018   QFTBGOLDPLUS NEGATIVE 11/03/2017    Speciality Comments: No specialty comments available.  Procedures:  Medium Joint Inj: R ankle on 09/07/2018 12:37 PM Indications: pain and joint swelling Details: 27 G 1.5 in needle, ultrasound-guided lateral approach Medications: 1 mL lidocaine 1 %; 30 mg triamcinolone acetonide 40 MG/ML  Aspirate: 0 mL Outcome: tolerated well, no immediate complications Procedure, treatment alternatives, risks and benefits explained, specific risks discussed. Consent was given by the patient. Immediately prior to procedure a time out was called to verify the correct patient, procedure, equipment, support staff and site/side marked as required. Patient was prepped and draped in the usual sterile fashion.     Allergies: Nickel, Ibuprofen, and Penicillins   Assessment / Plan:     Visit Diagnoses: Psoriatic arthritis ([place]) - He presents today with right ankle joint tenderness.  His right ankle joint discomfort and inflammation started 2 days ago.  He has not had any recent injuries but continues to have intermittent right ankle joint pain.  He has had difficulty bearing weight.  He has discomfort with plantarflexion and eversion of the right foot.  X-rays of the right ankle joint were obtained today and were unremarkable.  He requested an ultrasound guided cortisone injection.  He tolerated the procedure well.  He was started on Cosentyx on 08/06/18 and recently completed the loading dose. He will continue on Cosentyx 300 mg sq injections every 28 days.  He has no other joint pain or joint swelling at this time.  He has no achilles tendonitis or plantar fasciitis.  He has no SI joint tenderness.  His psoriasis has cleared since switching from Enbrel to Cosentyx.  He will continue on cosentyx as prescribed.  He does not need any refills.  He was advised to notify us if his right ankle joint pain persists or worsens.  He will follow up in 3 months.   Psoriasis - He has no active psoriasis at this time.  His psoriasis has cleared since starting on Cosentyx.    High risk medication use -Cosentyx 300 mg sq injection every 28 days.  CBC and CMP were drawn today to monitor for drug toxicity.   TB gold negative on 11/03/17. Future order for TB gold was placed today.  Plan: CBC with Differential/Platelet, COMPLETE  METABOLIC PANEL WITH GFR, QuantiFERON-TB Gold Plus  Pain in right ankle and joints of right foot - He presents today with right ankle joint pain.  He has tenderness inferior to the right lateral mallelus and along the lateral aspect of the right foot.  He has discomfort with plantarflexion and eversion of the right foot.  X-rays of the right ankle joint were obtained today and were unremarkable.  An ultrasound guided cortisone injection was performed.  He tolerated the procedure well.  Procedure note completed above. Plan: Medium Joint Inj: R ankle, XR Ankle Complete Right  Orders: Orders Placed This Encounter  Procedures  . Medium Joint Inj: R ankle  . XR Ankle Complete Right  . CBC with Differential/Platelet  . COMPLETE METABOLIC PANEL WITH GFR  . QuantiFERON-TB Gold Plus   No orders of the defined types were placed in this encounter.   Face-to-face time spent with patient was 30 minutes. Greater than 50% of time was spent in counseling and coordination of care.  Follow-Up Instructions: Return in about 3 months (around 12/08/2018) for Psoriatic arthritis.   Gearldine Bienenstockaylor M Joshua Zeringue, PA-C   I examined and evaluated the patient with Sherron Alesaylor Liyat Faulkenberry PA.  Patient is clinically doing better on Cosentyx.  He has noticed improvement in the psoriasis.  He continues to have pain and discomfort in his right ankle and right foot.  He had tenderness over posterior tibialis tendon and also below the lateral malleolus.  He has difficulty with dorsiflexion and plantarflexion.  He also had discomfort with inversion of his foot.  I injected the ankle joint as described above.  We will see response to it.  I have advised him to contact me in case the symptoms persist.  Otherwise we will see him back in 3 months.  The plan of care was discussed as noted above.  Pollyann SavoyShaili Deveshwar, MD  Note - This record has been created using [employer] software.  Chart creation errors have been sought, but may not always  have been located.  Such creation errors do not reflect on  the standard of medical care.

## 2018-09-07 NOTE — Telephone Encounter (Signed)
Patient is scheduled for today at 12:15pm for right ankle pain.

## 2018-09-07 NOTE — Telephone Encounter (Signed)
Received voicemail from patient informing us he is having a psoriatic flare.  He was switched from Enbrel to Cosentyx at the end of June due to inadequate response.  He has received two rounds of prednisone taper this summer for a flare.  He also received a cortisone injection which he found effective.  He is interested in a cortisone injection or other alternative options.  Please schedule appointment for patient for further assessment.  Mariella Saa, PharmD, [place], [place] Clinical Specialty Pharmacist [phone]  09/07/2018 11:16 AM

## 2018-09-11 ENCOUNTER — Telehealth: Payer: Self-pay | Admitting: Rheumatology

## 2018-09-11 MED ORDER — PREDNISONE 5 MG PO TABS: ORAL_TABLET | ORAL | 0 refills | Status: DC

## 2018-09-11 NOTE — Telephone Encounter (Signed)
Per Dr. Estanislado Pandy okay to send in prednisone taper starting at 20 mg and tapering by 5 mg every 4 days. Patient advised and prescription sent to the pharmacy. Patient advised to contact the office if symptoms persist after prednisone taper.

## 2018-09-11 NOTE — Telephone Encounter (Signed)
Patient called stating he received a cortisone injection in his right ankle on 09/07/18 and still experiencing pain and swelling.  Patient is requesting a return call before the end of the day to let him know if there is something else he could do.  Patient states he has taken Prednisone in the past which has helped.

## 2018-09-14 ENCOUNTER — Telehealth: Payer: Self-pay | Admitting: Pharmacist

## 2018-09-14 DIAGNOSIS — L4050 Arthropathic psoriasis, unspecified: Secondary | ICD-10-CM

## 2018-09-14 DIAGNOSIS — M25571 Pain in right ankle and joints of right foot: Secondary | ICD-10-CM

## 2018-09-14 MED ORDER — PREDNISONE 5 MG PO TABS: ORAL_TABLET | ORAL | 0 refills | Status: AC

## 2018-09-14 NOTE — Telephone Encounter (Signed)
Ok to send in prednisone taper starting at 20 mg tapering by 5 mg every 4 days.

## 2018-09-14 NOTE — Telephone Encounter (Signed)
Patient left voicemail on Friday complaining of ankle swelling.  He received a cortisone injection on Monday 8/3.  He reports it has helped with pain but not swelling.  He wants to know if he would be able to take a prednisone taper or if there were other alternatives he may try.  He is allergic to NSAID's.  Please advise.

## 2018-09-14 NOTE — Telephone Encounter (Signed)
Thank you for informing me and placing the future order for uric acid.

## 2018-09-14 NOTE — Telephone Encounter (Signed)
Called patient to notify of prescription taper.  He called the office again on Friday and one was sent.  [facility].    Patient called and asked if he had been tested for gout.  Someone mentioned to him that his symptoms could be attributed to gout.  Informed patient that sometimes patient's can have an overlap with psoriatic arthritis and gout.  His uric acid level has never been tested in our office.  Informed patient that the treatment for a gout flare can include prednisone.  Will follow up at his next office visit.  All questions encouraged and answered.  Instructed patient to call with any further questions or concerns.  Mariella Saa, PharmD, [place], [place] Clinical Specialty Pharmacist [phone]  09/14/2018 10:34 AM

## 2018-09-30 ENCOUNTER — Other Ambulatory Visit: Payer: Self-pay | Admitting: Rheumatology

## 2018-09-30 DIAGNOSIS — L409 Psoriasis, unspecified: Secondary | ICD-10-CM

## 2018-09-30 DIAGNOSIS — L4050 Arthropathic psoriasis, unspecified: Secondary | ICD-10-CM

## 2018-09-30 NOTE — Telephone Encounter (Signed)
Last Visit: 09/07/18 Next Visit: 12/07/18 Labs: 07/28/18 WNL TB Gold: 10/28/18 Neg   Okay to refill per Dr. Estanislado Pandy

## 2018-10-13 ENCOUNTER — Encounter: Payer: Self-pay | Admitting: Rheumatology

## 2018-10-13 ENCOUNTER — Telehealth: Payer: Self-pay | Admitting: Pharmacist

## 2018-10-13 ENCOUNTER — Telehealth (INDEPENDENT_AMBULATORY_CARE_PROVIDER_SITE_OTHER): Payer: BC Managed Care – PPO | Admitting: Rheumatology

## 2018-10-13 DIAGNOSIS — L4050 Arthropathic psoriasis, unspecified: Secondary | ICD-10-CM | POA: Diagnosis not present

## 2018-10-13 DIAGNOSIS — M1A0710 Idiopathic chronic gout, right ankle and foot, without tophus (tophi): Secondary | ICD-10-CM

## 2018-10-13 DIAGNOSIS — Z8639 Personal history of other endocrine, nutritional and metabolic disease: Secondary | ICD-10-CM

## 2018-10-13 DIAGNOSIS — Z79899 Other long term (current) drug therapy: Secondary | ICD-10-CM | POA: Diagnosis not present

## 2018-10-13 DIAGNOSIS — L409 Psoriasis, unspecified: Secondary | ICD-10-CM

## 2018-10-13 DIAGNOSIS — M25571 Pain in right ankle and joints of right foot: Secondary | ICD-10-CM

## 2018-10-13 MED ORDER — ALLOPURINOL 300 MG PO TABS: ORAL_TABLET | ORAL | 0 refills | Status: DC

## 2018-10-13 MED ORDER — COLCHICINE 0.6 MG PO TABS: 0.6000 mg | ORAL_TABLET | Freq: Every day | ORAL | 0 refills | Status: DC

## 2018-10-13 NOTE — Telephone Encounter (Signed)
Patient left voicemail.  States he continues to have ankle pain and swelling.  He was switched from Enbrel to Cosentyx on 08/06/2018.  He has received 3 prednisone tapers and 1 steroid injection since starting Cosentyx.  Patient is concerned that Cosentyx has not improved ankle issues.  In the past patient had concerns that his issue could be due to gout which he has never been tested for.  He has no follow up appointment scheduled. Recommend patient schedule an appointment to examine and discuss other medication options.  Please advise.   Mariella Saa, PharmD, [place], [place] Clinical Specialty Pharmacist [phone]  10/13/2018 11:37 AM

## 2018-10-13 NOTE — Progress Notes (Signed)
Virtual Visit via Video Note  I connected with Colin Jensen on 10/13/18 at  4:30 PM EDT by a video enabled telemedicine application and verified that I am speaking with the correct person using two identifiers.  Location: Patient: Home  Provider: Clinic  This service was conducted via virtual visit.  Both audio and visual tools were used.  The patient was located at home. I was located in my office.  Consent was obtained prior to the virtual visit and is aware of possible charges through their insurance for this visit.  The patient is an established patient.  Dr. Estanislado Pandy, MD conducted the virtual visit and Hazel Sams, PA-C acted as scribe during the service.  Office staff helped with scheduling follow up visits after the service was conducted.   I discussed the limitations of evaluation and management by telemedicine and the availability of in person appointments. The patient expressed understanding and agreed to proceed.   CC: Right ankle joint pain  History of Present Illness: Patient is a 33 year old male with a past medical history of psoriatic arthritis and gout.  He was started on Cosentyx 300 mg sq injections every 28 days on 08/06/18.   He continues to have right ankle joint pain and swelling.  He had x-rays of the right ankle on 09/07/18 which was unremarkable.  He had a cortisone injection which provided very temporary relief. He continues to have right ankle joint pain and joint swelling. He was recently diagnosed with gout by his PCP.  His uric acid was 8.0 on 09/30/18.  He would like to discuss treatment options.    Review of Systems  Constitutional: Negative for fever and malaise/fatigue.  Eyes: Negative for photophobia, pain, discharge and redness.  Respiratory: Negative for cough, shortness of breath and wheezing.   Cardiovascular: Negative for chest pain and palpitations.  Gastrointestinal: Negative for blood in stool, constipation and diarrhea.  Genitourinary: Negative for  dysuria.  Musculoskeletal: Positive for joint pain. Negative for back pain, myalgias and neck pain.       +Morning stiffness  +Joint swelling   Skin: Negative for rash.       Denies psoriasis   Neurological: Negative for dizziness and headaches.  Psychiatric/Behavioral: Negative for depression. The patient is not nervous/anxious and does not have insomnia.       Observations/Objective: Physical Exam  Constitutional: He is oriented to person, place, and time and well-developed, well-nourished, and in no distress.  HENT:  Head: Normocephalic and atraumatic.  Eyes: Conjunctivae are normal.  Pulmonary/Chest: Effort normal.  Neurological: He is alert and oriented to person, place, and time.  Psychiatric: Mood, memory, affect and judgment normal.   Patient reports morning stiffness for 30 minutes.   Patient reports nocturnal pain.  Difficulty dressing/grooming: Denies Difficulty climbing stairs: Reports Difficulty getting out of chair: Denies Difficulty using hands for taps, buttons, cutlery, and/or writing: Denies   Assessment and Plan: Visit Diagnoses: Psoriatic arthritis ([place]) - He has not had any recent psoriatic arthritis flares.  He was started on Cosentyx 300 mg sq injections every 28 days on 08/06/18.  He continues to have right ankle joint pain and swelling. Uric acid level was drawn on 09/30/18 which was 8.0.  He will be starting on Colchicine and allopurinol as described below.  He has no other joint pain or joint swelling at this time.  He has no achilles tendonitis or plantar fasciitis.  He has no SI joint tenderness.  He has no psoriasis at this  time. He will continue on Cosentyx as prescribed.  He will follow up on 12/07/18.   Psoriasis - He has no active psoriasis at this time.    High risk medication use -Cosentyx 300 mg sq injection every 28 days (started on 08/06/18).  CBC and CMP were drawn on 07/28/18.  TB gold negative 11/03/28.  Future orders placed today.   Pain in  right ankle and joints of right foot - Tenderness inferior to the lateral mallelus of the right ankle along the peroneal tendon. X-rays from 09/06/28 of the right ankle joint were were unremarkable.  An ultrasound guided cortisone injection was performed on 09/07/18. He has persistent right ankle joint pain and swelling.  He was diagnosed with gout by his PCP.    Idiopathic chronic gout of right ankle without tophus: Uric acid was 8.0 on 09/30/18. He is currently having a gout flare in the right ankle joint.  He has tenderness and swelling on the lateral aspect of the right ankle joint.  We discussed the disease process of gout. All questions were addressed.We discussed the importance of avoiding foods high in protein (red meat, shellfish) and alcohol (beer). He has tried to limit his intake of red meat and has not eaten shellfish recently.  We discussed the importance of weight loss. He has lost 9 lbs in 2 weeks.    He will start taking colchicine 0.6 mg 1 tablet by mouth daily.  Indications, contraindications, and potential side effects of colchicine were discussed. A prescription will be sent today.  He was advised to take colchicine 0.6 mg 2 tablets daily when flaring. He will start taking Allopurinol 150 mg 1 tablet by mouth daily 5 days after starting colchicine.  He will take allopurinol 150 mg 1 tablet by mouth daily for 2 weeks then increase to 300 mg by mouth daily. We will recheck uric acid level at the end of this month.  Orders placed today.   Follow Up Instructions: He will follow up on 12/07/18.    I discussed the assessment and treatment plan with the patient. The patient was provided an opportunity to ask questions and all were answered. The patient agreed with the plan and demonstrated an understanding of the instructions.   The patient was advised to call back or seek an in-person evaluation if the symptoms worsen or if the condition fails to improve as anticipated.  I provided 30 minutes of  non-face-to-face time during this encounter.  Pollyann SavoyShaili Deveshwar, MD   Scribed by- Gearldine Bienenstockaylor M Dale, PA-C

## 2018-10-13 NOTE — Telephone Encounter (Signed)
I called patient.  We will schedule a virtual visit this evening.

## 2018-10-13 NOTE — Telephone Encounter (Signed)
I could not reach Colin Jensen.  Please advise him to get uric acid test.  We should see him back in the office to discuss possible combination therapy.

## 2018-10-14 ENCOUNTER — Telehealth: Payer: Self-pay | Admitting: Rheumatology

## 2018-10-14 NOTE — Telephone Encounter (Signed)
Spoke with patient and advised  he will take Allopurinol 150 mg 1 tablet by mouth daily 5 days after starting colchicine.  He will take allopurinol 150 mg 1 tablet by mouth daily for 2 weeks then increase to 300 mg by mouth daily. Patient verbalized understanding.

## 2018-10-14 NOTE — Telephone Encounter (Signed)
Patient left a voicemail stating he had a virtual appointment yesterday with Dr. Estanislado Pandy and was given new prescriptions.  Patient is requesting a return call before he starts the medications to make sure he is taking them correctly.

## 2018-10-22 ENCOUNTER — Other Ambulatory Visit: Payer: Self-pay | Admitting: Rheumatology

## 2018-10-22 DIAGNOSIS — L409 Psoriasis, unspecified: Secondary | ICD-10-CM

## 2018-10-22 DIAGNOSIS — L4050 Arthropathic psoriasis, unspecified: Secondary | ICD-10-CM

## 2018-10-22 NOTE — Telephone Encounter (Signed)
Last Visit: 10/13/18 Next Visit: 12/07/18  Labs: 07/28/18 WNL TB Gold: 11/03/17  Okay to refill per Dr. Estanislado Pandy

## 2018-10-26 ENCOUNTER — Telehealth: Payer: Self-pay | Admitting: Rheumatology

## 2018-10-26 MED ORDER — PREDNISONE 5 MG PO TABS: ORAL_TABLET | ORAL | 0 refills | Status: DC

## 2018-10-26 NOTE — Telephone Encounter (Signed)
Patient started Allopurinol last Monday, and wore walking boot until Wednesday on lt foot. Woke up this morning with swelling, and lots of pain in right foot. [facility] [place]. Patient back at work today, and cannot come in for appointment until  Wednesday, or Friday. 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

## 2018-10-26 NOTE — Telephone Encounter (Signed)
Patient states he has started the Colchicine and the Allopurinol. Patient states he has been using a walking boot. Patient states he felt good enough to take the walking boot off on  10/21/18. Patient states he was able to put a regular shoe on. Patient states he woke up this morning with pain in his right. Patient states it is tender and swollen this morning. Patient states he is taking his medication as prescribed and has not missed any doses. Please advise.

## 2018-10-26 NOTE — Telephone Encounter (Signed)
I had detailed discussion with the patient.  It is difficult to differentiate if he is having a flare from gout or psoriatic arthritis.  I have advised him to increase colchicine to twice a day until the swelling goes down and then go back to once a day.  The side effect of diarrhea was discussed.  I would also send in a prednisone taper in case he needs 1 which will be prednisone 20 mg p.o. daily for 2 days and then taper by 5 mg every 2 days.  If he has persistent swelling he should notify us.  In case he continues to have swelling then we may have to consider switching to another biologic for psoriatic arthritis.

## 2018-10-26 NOTE — Telephone Encounter (Signed)
Prednisone taper has been sent to patient's local pharmacy.

## 2018-10-28 ENCOUNTER — Other Ambulatory Visit: Payer: Self-pay | Admitting: Physician Assistant

## 2018-10-30 ENCOUNTER — Ambulatory Visit: Payer: BC Managed Care – PPO | Admitting: Rheumatology

## 2018-11-04 ENCOUNTER — Other Ambulatory Visit: Payer: Self-pay

## 2018-11-04 DIAGNOSIS — Z79899 Other long term (current) drug therapy: Secondary | ICD-10-CM

## 2018-11-05 NOTE — Progress Notes (Signed)
CBC and CMP WNL

## 2018-11-06 LAB — CBC WITH DIFFERENTIAL/PLATELET
Absolute Monocytes: 464 cells/uL (ref 200–950)
Basophils Absolute: 72 cells/uL (ref 0–200)
Basophils Relative: 1.6 %
Eosinophils Absolute: 99 cells/uL (ref 15–500)
Eosinophils Relative: 2.2 %
HCT: 44.3 % (ref 38.5–50.0)
Hemoglobin: 15.1 g/dL (ref 13.2–17.1)
Lymphs Abs: 1427 cells/uL (ref 850–3900)
MCH: 31.1 pg (ref 27.0–33.0)
MCHC: 34.1 g/dL (ref 32.0–36.0)
MCV: 91.3 fL (ref 80.0–100.0)
MPV: 10.7 fL (ref 7.5–12.5)
Monocytes Relative: 10.3 %
Neutro Abs: 2439 cells/uL (ref 1500–7800)
Neutrophils Relative %: 54.2 %
Platelets: 202 10*3/uL (ref 140–400)
RBC: 4.85 10*6/uL (ref 4.20–5.80)
RDW: 13 % (ref 11.0–15.0)
Total Lymphocyte: 31.7 %
WBC: 4.5 10*3/uL (ref 3.8–10.8)

## 2018-11-06 LAB — COMPLETE METABOLIC PANEL WITH GFR
AG Ratio: 1.7 (calc) (ref 1.0–2.5)
ALT: 27 U/L (ref 9–46)
AST: 23 U/L (ref 10–40)
Albumin: 4.5 g/dL (ref 3.6–5.1)
Alkaline phosphatase (APISO): 52 U/L (ref 36–130)
BUN: 16 mg/dL (ref 7–25)
CO2: 28 mmol/L (ref 20–32)
Calcium: 9.6 mg/dL (ref 8.6–10.3)
Chloride: 101 mmol/L (ref 98–110)
Creat: 0.87 mg/dL (ref 0.60–1.35)
GFR, Est African American: 130 mL/min/{1.73_m2} (ref >=60)
GFR, Est Non African American: 112 mL/min/{1.73_m2} (ref >=60)
Globulin: 2.6 g/dL (calc) (ref 1.9–3.7)
Glucose, Bld: 88 mg/dL (ref 65–99)
Potassium: 4.3 mmol/L (ref 3.5–5.3)
Sodium: 137 mmol/L (ref 135–146)
Total Bilirubin: 0.7 mg/dL (ref 0.2–1.2)
Total Protein: 7.1 g/dL (ref 6.1–8.1)

## 2018-11-06 LAB — QUANTIFERON-TB GOLD PLUS
Mitogen-NIL: >10 IU/mL
NIL: 0.09 IU/mL
QuantiFERON-TB Gold Plus: NEGATIVE
TB1-NIL: <0 IU/mL
TB2-NIL: 0.02 IU/mL

## 2018-11-06 NOTE — Progress Notes (Signed)
TB gold negative

## 2018-11-14 ENCOUNTER — Other Ambulatory Visit: Payer: Self-pay | Admitting: Physician Assistant

## 2018-11-14 DIAGNOSIS — M1A0710 Idiopathic chronic gout, right ankle and foot, without tophus (tophi): Secondary | ICD-10-CM

## 2018-11-16 ENCOUNTER — Other Ambulatory Visit: Payer: Self-pay | Admitting: Pharmacist

## 2018-11-16 NOTE — Telephone Encounter (Signed)
Patient returned call.  He was able to increase to the 300 mg daily dose.  He notified that he decided to take the prednisone taper and is on day 5.    Instructed patient to call with any other questions or concerns.   Mariella Saa, PharmD, [place], [place] Clinical Specialty Pharmacist [phone]  11/16/2018 4:38 PM

## 2018-11-16 NOTE — Telephone Encounter (Signed)
Last Visit: 10/13/18 Next Visit: 12/07/18 Labs:  11/04/18   Attempted to contact the patient to verify dose of Allopurinol. Left message for patient to call the office.

## 2018-11-17 ENCOUNTER — Other Ambulatory Visit: Payer: Self-pay | Admitting: Rheumatology

## 2018-11-23 NOTE — Progress Notes (Deleted)
   Office Visit Note  Patient: Colin Jensen             Date of Birth: Nov 07, 1985           MRN: [id number]             PCP: Berenice Primas Referring: Berenice Primas, NP Visit Date: 12/07/2018 Occupation: @GUAROCC @  Subjective:  No chief complaint on file.   History of Present Illness: Colin Jensen is a 33 y.o. male ***   Activities of Daily Living:  Patient reports morning stiffness for *** {minute/hour:19697}.   Patient {ACTIONS;DENIES/REPORTS:21021675::"Denies"} nocturnal pain.  Difficulty dressing/grooming: {ACTIONS;DENIES/REPORTS:21021675::"Denies"} Difficulty climbing stairs: {ACTIONS;DENIES/REPORTS:21021675::"Denies"} Difficulty getting out of chair: {ACTIONS;DENIES/REPORTS:21021675::"Denies"} Difficulty using hands for taps, buttons, cutlery, and/or writing: {ACTIONS;DENIES/REPORTS:21021675::"Denies"}  No Rheumatology ROS completed.   PMFS History:  Patient Active Problem List   Diagnosis Date Noted  . History of obesity 05/01/2016  . Psoriatic arthritis ([place]) 12/06/2015  . Psoriasis 12/06/2015  . High risk medication use 12/06/2015    Past Medical History:  Diagnosis Date  . Psoriatic arthritis ([place])     Family History  Problem Relation Age of Onset  . Heart attack Father   . Psoriasis Father   . Diabetes Sister   . Healthy Son    No past surgical history on file. Social History   Social History Narrative  . Not on file   Immunization History  Administered Date(s) Administered  . Influenza Inj Mdck Quad With Preservative 01/23/2018  . Tdap 01/23/2018     Objective: Vital Signs: There were no vitals taken for this visit.   Physical Exam   Musculoskeletal Exam: ***  CDAI Exam: CDAI Score: - Patient Global: -; Provider Global: - Swollen: -; Tender: - Joint Exam   No joint exam has been documented for this visit   There is currently no information documented on the homunculus. Go to the Rheumatology activity and complete the  homunculus joint exam.  Investigation: No additional findings.  Imaging: No results found.  Recent Labs: Lab Results  Component Value Date   WBC 4.5 11/04/2018   HGB 15.1 11/04/2018   PLT 202 11/04/2018   NA 137 11/04/2018   K 4.3 11/04/2018   CL 101 11/04/2018   CO2 28 11/04/2018   GLUCOSE 88 11/04/2018   BUN 16 11/04/2018   CREATININE 0.87 11/04/2018   BILITOT 0.7 11/04/2018   ALKPHOS 46 05/07/2016   AST 23 11/04/2018   ALT 27 11/04/2018   PROT 7.1 11/04/2018   ALBUMIN 4.2 05/07/2016   CALCIUM 9.6 11/04/2018   GFRAA 130 11/04/2018   QFTBGOLDPLUS NEGATIVE 11/04/2018    Speciality Comments: No specialty comments available.  Procedures:  No procedures performed Allergies: Nickel, Ibuprofen, and Penicillins   Assessment / Plan:     Visit Diagnoses: No diagnosis found.  Orders: No orders of the defined types were placed in this encounter.  No orders of the defined types were placed in this encounter.   Face-to-face time spent with patient was *** minutes. Greater than 50% of time was spent in counseling and coordination of care.  Follow-Up Instructions: No follow-ups on file.   Ofilia Neas, PA-C  Note - This record has been created using Dragon software.  Chart creation errors have been sought, but may not always  have been located. Such creation errors do not reflect on  the standard of medical care.

## 2018-11-24 ENCOUNTER — Other Ambulatory Visit: Payer: Self-pay | Admitting: *Deleted

## 2018-11-24 DIAGNOSIS — Z20822 Contact with and (suspected) exposure to covid-19: Secondary | ICD-10-CM

## 2018-11-25 LAB — NOVEL CORONAVIRUS, NAA: SARS-CoV-2, NAA: NOT DETECTED

## 2018-11-27 NOTE — Progress Notes (Signed)
Office Visit Note  Patient: Colin Jensen             Date of Birth: [birth year]           MRN: [id number]             PCP: Berenice Primas Referring: Berenice Primas, NP Visit Date: 12/11/2018 Occupation: @GUAROCC @  Subjective:  Medication monitoring .   History of Present Illness: Colin Jensen is a 33 y.o. male with history of psoriatic arthritis and gouty arthropathy.  He states he has done some dietary modifications with low purine diet and avoiding alcohol.  He has not had any gout flares since August.  He is doing well on Cosentyx.  He denies any joint pain or joint swelling currently.  Activities of Daily Living:  Patient reports morning stiffness for 5 minutes.   Patient Denies nocturnal pain.  Difficulty dressing/grooming: Denies Difficulty climbing stairs: Denies Difficulty getting out of chair: Denies Difficulty using hands for taps, buttons, cutlery, and/or writing: Denies  Review of Systems  Constitutional: Negative for fatigue and night sweats.  HENT: Negative for mouth sores, mouth dryness and nose dryness.   Eyes: Negative for redness and dryness.  Respiratory: Negative for shortness of breath and difficulty breathing.   Cardiovascular: Negative for chest pain, palpitations, hypertension, irregular heartbeat and swelling in legs/feet.  Gastrointestinal: Negative for constipation and diarrhea.  Endocrine: Negative for increased urination.  Genitourinary: Negative for difficulty urinating.  Musculoskeletal: Positive for morning stiffness. Negative for arthralgias, joint pain, joint swelling, myalgias, muscle weakness, muscle tenderness and myalgias.  Skin: Negative for color change, rash, hair loss, nodules/bumps, skin tightness, ulcers and sensitivity to sunlight.  Allergic/Immunologic: Negative for susceptible to infections.  Neurological: Negative for dizziness, fainting, memory loss, night sweats and weakness.  Hematological: Negative for  bruising/bleeding tendency and swollen glands.  Psychiatric/Behavioral: Negative for depressed mood and sleep disturbance. The patient is not nervous/anxious.     PMFS History:  Patient Active Problem List   Diagnosis Date Noted  . History of obesity 05/01/2016  . Psoriatic arthritis ([place]) 12/06/2015  . Psoriasis 12/06/2015  . High risk medication use 12/06/2015    Past Medical History:  Diagnosis Date  . Gout   . Psoriatic arthritis ([place])     Family History  Problem Relation Age of Onset  . Heart attack Father   . Psoriasis Father   . Diabetes Sister   . Healthy Son    History reviewed. No pertinent surgical history. Social History   Social History Narrative  . Not on file   Immunization History  Administered Date(s) Administered  . Influenza Inj Mdck Quad With Preservative 01/23/2018  . Tdap 01/23/2018     Objective: Vital Signs: BP 96/61 (BP Location: Left Arm, Patient Position: Sitting, Cuff Size: Normal)   Pulse 73   Resp 12   Ht 6' (1.829 m)   Wt 257 lb 9.6 oz (116.8 kg)   BMI 34.94 kg/m    Physical Exam Vitals signs and nursing note reviewed.  Constitutional:      Appearance: He is well-developed.  HENT:     Head: Normocephalic and atraumatic.  Eyes:     Conjunctiva/sclera: Conjunctivae normal.     Pupils: Pupils are equal, round, and reactive to light.  Neck:     Musculoskeletal: Normal range of motion and neck supple.  Cardiovascular:     Rate and Rhythm: Normal rate and regular rhythm.     Heart sounds: Normal heart sounds.  Pulmonary:     Effort: Pulmonary effort is normal.     Breath sounds: Normal breath sounds.  Abdominal:     General: Bowel sounds are normal.     Palpations: Abdomen is soft.  Skin:    General: Skin is warm and dry.     Capillary Refill: Capillary refill takes less than 2 seconds.  Neurological:     Mental Status: He is alert and oriented to person, place, and time.  Psychiatric:        Behavior: Behavior normal.       Musculoskeletal Exam: C-spine thoracic and lumbar spine were in good range of motion.  He had no SI joint tenderness.  Shoulder joints, elbow joints, wrist joints, MCPs PIPs and DIPs with good range of motion with no synovitis.  Hip joints, knee joints, ankles, MTPs PIPs were in good range of motion with no synovitis.  CDAI Exam: CDAI Score: - Patient Global: -; Provider Global: - Swollen: -; Tender: - Joint Exam   No joint exam has been documented for this visit   There is currently no information documented on the homunculus. Go to the Rheumatology activity and complete the homunculus joint exam.  Investigation: No additional findings.  Imaging: No results found.  Recent Labs: Lab Results  Component Value Date   WBC 4.5 11/04/2018   HGB 15.1 11/04/2018   PLT 202 11/04/2018   NA 137 11/04/2018   K 4.3 11/04/2018   CL 101 11/04/2018   CO2 28 11/04/2018   GLUCOSE 88 11/04/2018   BUN 16 11/04/2018   CREATININE 0.87 11/04/2018   BILITOT 0.7 11/04/2018   ALKPHOS 46 05/07/2016   AST 23 11/04/2018   ALT 27 11/04/2018   PROT 7.1 11/04/2018   ALBUMIN 4.2 05/07/2016   CALCIUM 9.6 11/04/2018   GFRAA 130 11/04/2018   QFTBGOLDPLUS NEGATIVE 11/04/2018    Speciality Comments: No specialty comments available.  Procedures:  No procedures performed Allergies: Nickel, Ibuprofen, and Penicillins   Assessment / Plan:     Visit Diagnoses: Psoriatic arthritis (HCC)-patient is doing much better on Cosentyx.  He had no joint pain or joint swelling.  Psoriasis-he states the psoriasis patches resolved on Cosentyx.  High risk medication use - Cosentyx 300 mg sq injection every 28 days (started on 08/06/18).  - Plan: CBC with diff, CMP with GFR today.  Idiopathic chronic gout of right ankle without tophus - allopurinol 300 mg p.o. daily, colchicine 0.6 mg p.o. daily. Uric acid was 8.0 on 09/30/18. - Plan: Uric acid today.  He is on dietary modifications.  If his uric acid just is below  6 and he is asymptomatic for 6 months then we can stop colchicine and use it only on as needed basis.  Orders: Orders Placed This Encounter  Procedures  . CBC with diff  . CMP with GFR  . Uric acid   No orders of the defined types were placed in this encounter.     Follow-Up Instructions: Return in about 5 months (around 05/11/2019) for Psoriatic arthritis, Gout.   Pollyann Savoy, MD  Note - This record has been created using Animal nutritionist.  Chart creation errors have been sought, but may not always  have been located. Such creation errors do not reflect on  the standard of medical care.

## 2018-11-30 ENCOUNTER — Other Ambulatory Visit: Payer: Self-pay | Admitting: Rheumatology

## 2018-11-30 DIAGNOSIS — L409 Psoriasis, unspecified: Secondary | ICD-10-CM

## 2018-11-30 DIAGNOSIS — L4050 Arthropathic psoriasis, unspecified: Secondary | ICD-10-CM

## 2018-11-30 NOTE — Telephone Encounter (Signed)
Last Visit: 10/13/18 Next Visit: 12/07/18 Labs:  11/04/18 WNL TB gold: 11/04/18 Neg   Okay to refill per Dr. Estanislado Pandy

## 2018-12-07 ENCOUNTER — Ambulatory Visit: Payer: BC Managed Care – PPO | Admitting: Rheumatology

## 2018-12-11 ENCOUNTER — Other Ambulatory Visit: Payer: Self-pay

## 2018-12-11 ENCOUNTER — Encounter: Payer: Self-pay | Admitting: Rheumatology

## 2018-12-11 ENCOUNTER — Ambulatory Visit: Payer: BC Managed Care – PPO | Admitting: Rheumatology

## 2018-12-11 VITALS — BP 96/61 | HR 73 | Resp 12 | Ht 72.0 in | Wt 257.6 lb

## 2018-12-11 DIAGNOSIS — L409 Psoriasis, unspecified: Secondary | ICD-10-CM

## 2018-12-11 DIAGNOSIS — M1A0710 Idiopathic chronic gout, right ankle and foot, without tophus (tophi): Secondary | ICD-10-CM | POA: Diagnosis not present

## 2018-12-11 DIAGNOSIS — L4050 Arthropathic psoriasis, unspecified: Secondary | ICD-10-CM | POA: Diagnosis not present

## 2018-12-11 DIAGNOSIS — Z79899 Other long term (current) drug therapy: Secondary | ICD-10-CM

## 2018-12-11 NOTE — Patient Instructions (Signed)
Standing Labs We placed an order today for your standing lab work.    Please come back and get your standing labs in February and every 3 months  We have open lab daily Monday through Thursday from 8:30-12:30 PM and 1:30-4:30 PM and Friday from 8:30-12:30 PM and 1:30-4:00 PM at the office of Dr. Tyjae Issa.   You may experience shorter wait times on Monday and Friday afternoons. The office is located at [address], [address], [place], [place] [address] No appointment is necessary.   Labs are drawn by [facility].  You may receive a bill from [facility] for your lab work.  If you wish to have your labs drawn at another location, please call the office 24 hours in advance to send orders.  If you have any questions regarding directions or hours of operation,  please call [phone].   Just as a reminder please drink plenty of water prior to coming for your lab work. Thanks!  

## 2018-12-12 LAB — COMPLETE METABOLIC PANEL WITH GFR
AG Ratio: 2 (calc) (ref 1.0–2.5)
ALT: 29 U/L (ref 9–46)
AST: 25 U/L (ref 10–40)
Albumin: 4.8 g/dL (ref 3.6–5.1)
Alkaline phosphatase (APISO): 51 U/L (ref 36–130)
BUN: 19 mg/dL (ref 7–25)
CO2: 29 mmol/L (ref 20–32)
Calcium: 9.9 mg/dL (ref 8.6–10.3)
Chloride: 101 mmol/L (ref 98–110)
Creat: 0.83 mg/dL (ref 0.60–1.35)
GFR, Est African American: 132 mL/min/{1.73_m2} (ref >=60)
GFR, Est Non African American: 114 mL/min/{1.73_m2} (ref >=60)
Globulin: 2.4 g/dL (calc) (ref 1.9–3.7)
Glucose, Bld: 85 mg/dL (ref 65–99)
Potassium: 4.6 mmol/L (ref 3.5–5.3)
Sodium: 138 mmol/L (ref 135–146)
Total Bilirubin: 0.7 mg/dL (ref 0.2–1.2)
Total Protein: 7.2 g/dL (ref 6.1–8.1)

## 2018-12-12 LAB — CBC WITH DIFFERENTIAL/PLATELET
Absolute Monocytes: 552 cells/uL (ref 200–950)
Basophils Absolute: 72 cells/uL (ref 0–200)
Basophils Relative: 1.5 %
Eosinophils Absolute: 110 cells/uL (ref 15–500)
Eosinophils Relative: 2.3 %
HCT: 46 % (ref 38.5–50.0)
Hemoglobin: 15.9 g/dL (ref 13.2–17.1)
Lymphs Abs: 1488 cells/uL (ref 850–3900)
MCH: 30.8 pg (ref 27.0–33.0)
MCHC: 34.6 g/dL (ref 32.0–36.0)
MCV: 89 fL (ref 80.0–100.0)
MPV: 10.5 fL (ref 7.5–12.5)
Monocytes Relative: 11.5 %
Neutro Abs: 2578 cells/uL (ref 1500–7800)
Neutrophils Relative %: 53.7 %
Platelets: 218 10*3/uL (ref 140–400)
RBC: 5.17 10*6/uL (ref 4.20–5.80)
RDW: 12.8 % (ref 11.0–15.0)
Total Lymphocyte: 31 %
WBC: 4.8 10*3/uL (ref 3.8–10.8)

## 2018-12-12 LAB — URIC ACID: Uric Acid, Serum: 5.2 mg/dL (ref 4.0–8.0)

## 2018-12-13 NOTE — Progress Notes (Signed)
CBC and CMP are normal.  Uric acid is at the desirable level.  Patient should continue current regimen.

## 2019-01-01 ENCOUNTER — Other Ambulatory Visit: Payer: Self-pay | Admitting: Physician Assistant

## 2019-01-04 NOTE — Telephone Encounter (Signed)
Notes recorded by Bo Merino, MD on 12/13/2018 at 8:47 AM EST  CBC and CMP are normal. Uric acid is at the desirable level. Patient should continue current regimen.  Last RF 10/13/2018 Last appt 12/11/2018 Next appt 05/14/2019

## 2019-01-04 NOTE — Telephone Encounter (Signed)
Ok to refill 

## 2019-02-08 ENCOUNTER — Telehealth: Payer: Self-pay | Admitting: Rheumatology

## 2019-02-08 MED ORDER — PREDNISONE 5 MG PO TABS: ORAL_TABLET | ORAL | 0 refills | Status: DC

## 2019-02-08 NOTE — Telephone Encounter (Signed)
Patient states when he woke up Saturday morning he was having soreness in left foot. Patient took a Colchicine in the morning and evening. Patient states it has continued to get worse and the Colchicine has not helped. Patient is taking allopurinol daily not missing any doses. Patient is requesting a prescription for prednisone to be sent to the [facility] on [address]

## 2019-02-08 NOTE — Telephone Encounter (Signed)
Attempted to contact the patient and left message for patient to call the office.  

## 2019-02-08 NOTE — Telephone Encounter (Signed)
Patient called requesting prescription refill of Prednisone [facility] [address] in [place].  Patient states he is having a flair of his gout.

## 2019-02-08 NOTE — Telephone Encounter (Signed)
Okay to give prednisone taper starting at 20 mg and taper by 5 mg every 2 days.

## 2019-02-08 NOTE — Telephone Encounter (Signed)
Prednisone taper has been sent to the pharmacy, patient is aware.  

## 2019-02-26 ENCOUNTER — Other Ambulatory Visit: Payer: Self-pay | Admitting: Rheumatology

## 2019-02-26 DIAGNOSIS — M1A0710 Idiopathic chronic gout, right ankle and foot, without tophus (tophi): Secondary | ICD-10-CM

## 2019-02-26 NOTE — Telephone Encounter (Signed)
Last Visit: 12/11/2018 Next Visit:  05/14/2019 Labs: 12/13/18 CBC and CMP are normal. Uric acid is at the desirable level. Patient should continue current regimen.  Okay to refill per Dr. Deveshwar  

## 2019-04-04 ENCOUNTER — Other Ambulatory Visit: Payer: Self-pay | Admitting: Physician Assistant

## 2019-04-05 NOTE — Telephone Encounter (Signed)
Last Visit: 12/11/2018 Next Visit:  05/14/2019 Labs: 12/13/18 CBC and CMP are normal. Uric acid is at the desirable level. Patient should continue current regimen.  Okay to refill per Dr. Corliss Skains

## 2019-04-19 ENCOUNTER — Telehealth: Payer: Self-pay | Admitting: Rheumatology

## 2019-04-19 MED ORDER — PREDNISONE 5 MG PO TABS: ORAL_TABLET | ORAL | 0 refills | Status: DC

## 2019-04-19 NOTE — Telephone Encounter (Signed)
Patient called stating he is having a flair with his gout symptoms and requesting a prescription refill of Prednisone to [facility] [address] in [place].

## 2019-04-19 NOTE — Telephone Encounter (Signed)
Patient advised prednisone taper sent to the pharmacy.  

## 2019-04-19 NOTE — Telephone Encounter (Signed)
Okay to give a short prednisone taper starting at 20 mg and taper 5 mg by every 2 days.

## 2019-04-19 NOTE — Telephone Encounter (Signed)
Patient last seen 12/11/18. Next Visit 05/14/19. Patient is on Allopurinol 300 mg daily, Colchicine 0.6 mg and Cosentyx 300 monthly. Patient states he having soreness in his right foot. Patient states it is on the outside of the foot. Patient denies missing any doses of his medication and states he does not recall doing anything differently or an injury. Patient states he has increased his walking with the warmer weather. Patient is requesting a prednisone taper. Please advise.

## 2019-05-10 NOTE — Progress Notes (Signed)
Office Visit Note  Patient: Colin Jensen             Date of Birth: 1985-11-19           MRN: [id number]             PCP: Berenice Primas Referring: Berenice Primas, NP Visit Date: 05/14/2019 Occupation: @GUAROCC @  Subjective:  Medication monitoring   History of Present Illness: Colin Jensen is a 34 y.o. male with history of psoriatic arthritis and gout.  He is on cosentyx 300 mg sq injections every 28 days.  He continues to have intermittent pain on the lateral aspect of both ankles.  He denies any joint swelling, warmth, or erythema during these episodes. He denies any other joint pain or joint swelling at this time. He denies any flares of psoriasis.  He is taking allopurinol 300 mg po daily and colchicine 0.6 mg 1 tablet daily for management of gout.  He denies any recent gout flares. He avoids red meat and alcohol.   Activities of Daily Living:  Patient reports morning stiffness for  10 minutes.   Patient Denies nocturnal pain.  Difficulty dressing/grooming: Denies Difficulty climbing stairs: Denies Difficulty getting out of chair: Denies Difficulty using hands for taps, buttons, cutlery, and/or writing: Denies  Review of Systems  Constitutional: Negative for fatigue and night sweats.  HENT: Negative for mouth sores, mouth dryness and nose dryness.   Eyes: Negative for redness and dryness.  Respiratory: Negative for cough, hemoptysis, shortness of breath and difficulty breathing.   Cardiovascular: Negative for chest pain, palpitations, hypertension, irregular heartbeat and swelling in legs/feet.  Gastrointestinal: Negative for constipation and diarrhea.  Endocrine: Negative for increased urination.  Genitourinary: Negative for painful urination.  Musculoskeletal: Positive for arthralgias, joint pain and morning stiffness. Negative for joint swelling, myalgias, muscle weakness, muscle tenderness and myalgias.  Skin: Negative for color change, rash, hair loss,  nodules/bumps, skin tightness, ulcers and sensitivity to sunlight.  Allergic/Immunologic: Negative for susceptible to infections.  Neurological: Negative for dizziness, fainting, memory loss, night sweats and weakness.  Hematological: Negative for swollen glands.  Psychiatric/Behavioral: Negative for depressed mood and sleep disturbance. The patient is not nervous/anxious.     PMFS History:  Patient Active Problem List   Diagnosis Date Noted  . History of obesity 05/01/2016  . Psoriatic arthritis ([place]) 12/06/2015  . Psoriasis 12/06/2015  . High risk medication use 12/06/2015    Past Medical History:  Diagnosis Date  . Gout   . Psoriatic arthritis ([place])     Family History  Problem Relation Age of Onset  . Heart attack Father   . Psoriasis Father   . Diabetes Sister   . Healthy Son    History reviewed. No pertinent surgical history. Social History   Social History Narrative  . Not on file   Immunization History  Administered Date(s) Administered  . Influenza Inj Mdck Quad With Preservative 01/23/2018  . Tdap 01/23/2018     Objective: Vital Signs: BP 117/85 (BP Location: Left Arm, Patient Position: Sitting, Cuff Size: Small)   Pulse 72   Resp 12   Ht 5\' 11"  (1.803 m)   Wt 256 lb 9.6 oz (116.4 kg)   BMI 35.79 kg/m    Physical Exam Vitals and nursing note reviewed.  Constitutional:      Appearance: He is well-developed.  HENT:     Head: Normocephalic and atraumatic.  Eyes:     Conjunctiva/sclera: Conjunctivae normal.     Pupils:  Pupils are equal, round, and reactive to light.  Pulmonary:     Effort: Pulmonary effort is normal.  Abdominal:     General: Bowel sounds are normal.     Palpations: Abdomen is soft.  Musculoskeletal:     Cervical back: Normal range of motion and neck supple.  Skin:    General: Skin is warm and dry.     Capillary Refill: Capillary refill takes less than 2 seconds.  Neurological:     Mental Status: He is alert and oriented to  person, place, and time.  Psychiatric:        Behavior: Behavior normal.      Musculoskeletal Exam:  C-spine, thoracic spine, and lumbar spine good ROM. No midline spinal tenderness.  No SI joint tenderness. Shoulder joints, elbow joints, wrist joints, MCPs, PIPs, and DIPs good ROM with no synovitis. Complete fist formation bilaterally.  Hip joints, knee joints, ankle joints, MTPs, PIPs, and DIPs good ROM with no synovitis.  No warmth or effusion of knee joints.  No tenderness or swelling of ankle joints. Hammertoes noted.   CDAI Exam: CDAI Score: -- Patient Global: --; Provider Global: -- Swollen: --; Tender: -- Joint Exam 05/14/2019   No joint exam has been documented for this visit   There is currently no information documented on the homunculus. Go to the Rheumatology activity and complete the homunculus joint exam.  Investigation: No additional findings.  Imaging: No results found.  Recent Labs: Lab Results  Component Value Date   WBC 4.8 12/11/2018   HGB 15.9 12/11/2018   PLT 218 12/11/2018   NA 138 12/11/2018   K 4.6 12/11/2018   CL 101 12/11/2018   CO2 29 12/11/2018   GLUCOSE 85 12/11/2018   BUN 19 12/11/2018   CREATININE 0.83 12/11/2018   BILITOT 0.7 12/11/2018   ALKPHOS 46 05/07/2016   AST 25 12/11/2018   ALT 29 12/11/2018   PROT 7.2 12/11/2018   ALBUMIN 4.2 05/07/2016   CALCIUM 9.9 12/11/2018   GFRAA 132 12/11/2018   QFTBGOLDPLUS NEGATIVE 11/04/2018    Speciality Comments: No specialty comments available.  Procedures:  No procedures performed Allergies: Nickel, Ibuprofen, and Penicillins   Assessment / Plan:     Visit Diagnoses: Psoriatic arthritis (HCC): He has no synovitis or dactylitis on exam.  He has not had any recent psoriatic arthritis flares.  He is clinically doing well on Cosentyx 300 mg subcutaneous injections every 28 days.  He experiences intermittent discomfort in the lateral aspect of both ankles which is likely due to over pronating  while walking.  We discussed the importance of wearing proper fitting shoes.  He has no tenderness or synovitis of his ankle joints on exam today.  He has no plantar fasciitis or Achilles tendinitis.  He has no SI joint tenderness on exam.  He is not experiencing any other joint pain or joint swelling at this time.  He has not had any recent flares of psoriasis.  He will continue on Cosentyx as prescribed.  He is advised to notify us if he develops increased joint pain or joint swelling.  He will follow-up in the office in 5 months.  Psoriasis - He has no active psoriasis at this time.  High risk medication use - Cosentyx 300 mg sq injection every 28 days (started on 08/06/18).  CBC and CMP were within normal limits on 12/11/2018.  He is due to update lab work today.  CBC and CMP orders were released.  He is  planning on receiving the [employer] COVID-19 vaccination soon.- Plan: CBC with Differential/Platelet, COMPLETE METABOLIC PANEL WITH GFR  Idiopathic chronic gout of right ankle without tophus -He has not had any recent gout flares.  He takes allopurinol 300 mg p.o. daily and colchicine 0.6 mg p.o. daily.  He has not missed any doses of allopurinol or colchicine recently.  His uric acid level was 5.2 on 12/11/2018.  We will recheck uric acid level today.  He was advised to notify us if he develops signs or symptoms of a gout flare.  He was encouraged to continue to avoid a purine rich diet and alcohol use.- Plan: Uric acid  Orders: Orders Placed This Encounter  Procedures  . CBC with Differential/Platelet  . COMPLETE METABOLIC PANEL WITH GFR  . Uric acid   No orders of the defined types were placed in this encounter.     Follow-Up Instructions: Return in about 5 months (around 10/14/2019) for Psoriatic arthritis, Gout.   Gearldine Bienenstock, PA-C  Note - This record has been created using Dragon software.  Chart creation errors have been sought, but may not always  have been located. Such  creation errors do not reflect on  the standard of medical care.

## 2019-05-14 ENCOUNTER — Other Ambulatory Visit: Payer: Self-pay

## 2019-05-14 ENCOUNTER — Encounter: Payer: Self-pay | Admitting: Physician Assistant

## 2019-05-14 ENCOUNTER — Ambulatory Visit: Payer: BC Managed Care – PPO | Admitting: Physician Assistant

## 2019-05-14 VITALS — BP 117/85 | HR 72 | Resp 12 | Ht 71.0 in | Wt 256.6 lb

## 2019-05-14 DIAGNOSIS — L4050 Arthropathic psoriasis, unspecified: Secondary | ICD-10-CM

## 2019-05-14 DIAGNOSIS — M1A0710 Idiopathic chronic gout, right ankle and foot, without tophus (tophi): Secondary | ICD-10-CM | POA: Diagnosis not present

## 2019-05-14 DIAGNOSIS — L409 Psoriasis, unspecified: Secondary | ICD-10-CM | POA: Diagnosis not present

## 2019-05-14 DIAGNOSIS — Z79899 Other long term (current) drug therapy: Secondary | ICD-10-CM | POA: Diagnosis not present

## 2019-05-14 NOTE — Patient Instructions (Signed)
Standing Labs We placed an order today for your standing lab work.    Please come back and get your standing labs in   We have open lab daily Monday through Thursday from 8:30-12:30 PM and 1:30-4:30 PM and Friday from 8:30-12:30 PM and 1:30-4:00 PM at the office of Dr. Shaili Deveshwar.   You may experience shorter wait times on Monday and Friday afternoons. The office is located at [address], [address], [place], [place] [address] No appointment is necessary.   Labs are drawn by [facility].  You may receive a bill from [facility] for your lab work.  If you wish to have your labs drawn at another location, please call the office 24 hours in advance to send orders.  If you have any questions regarding directions or hours of operation,  please call [phone].   Just as a reminder please drink plenty of water prior to coming for your lab work. Thanks!  

## 2019-05-15 LAB — COMPLETE METABOLIC PANEL WITH GFR
AG Ratio: 1.8 (calc) (ref 1.0–2.5)
ALT: 28 U/L (ref 9–46)
AST: 24 U/L (ref 10–40)
Albumin: 4.6 g/dL (ref 3.6–5.1)
Alkaline phosphatase (APISO): 51 U/L (ref 36–130)
BUN: 18 mg/dL (ref 7–25)
CO2: 30 mmol/L (ref 20–32)
Calcium: 9.7 mg/dL (ref 8.6–10.3)
Chloride: 103 mmol/L (ref 98–110)
Creat: 0.82 mg/dL (ref 0.60–1.35)
GFR, Est African American: 133 mL/min/{1.73_m2} (ref >=60)
GFR, Est Non African American: 115 mL/min/{1.73_m2} (ref >=60)
Globulin: 2.6 g/dL (calc) (ref 1.9–3.7)
Glucose, Bld: 84 mg/dL (ref 65–99)
Potassium: 4.2 mmol/L (ref 3.5–5.3)
Sodium: 140 mmol/L (ref 135–146)
Total Bilirubin: 0.7 mg/dL (ref 0.2–1.2)
Total Protein: 7.2 g/dL (ref 6.1–8.1)

## 2019-05-15 LAB — CBC WITH DIFFERENTIAL/PLATELET
Absolute Monocytes: 380 cells/uL (ref 200–950)
Basophils Absolute: 49 cells/uL (ref 0–200)
Basophils Relative: 1.3 %
Eosinophils Absolute: 91 cells/uL (ref 15–500)
Eosinophils Relative: 2.4 %
HCT: 46.1 % (ref 38.5–50.0)
Hemoglobin: 15.4 g/dL (ref 13.2–17.1)
Lymphs Abs: 1243 cells/uL (ref 850–3900)
MCH: 30.7 pg (ref 27.0–33.0)
MCHC: 33.4 g/dL (ref 32.0–36.0)
MCV: 92 fL (ref 80.0–100.0)
MPV: 10.9 fL (ref 7.5–12.5)
Monocytes Relative: 10 %
Neutro Abs: 2037 cells/uL (ref 1500–7800)
Neutrophils Relative %: 53.6 %
Platelets: 182 10*3/uL (ref 140–400)
RBC: 5.01 10*6/uL (ref 4.20–5.80)
RDW: 13.1 % (ref 11.0–15.0)
Total Lymphocyte: 32.7 %
WBC: 3.8 10*3/uL (ref 3.8–10.8)

## 2019-05-15 LAB — URIC ACID: Uric Acid, Serum: 5.3 mg/dL (ref 4.0–8.0)

## 2019-05-16 NOTE — Progress Notes (Signed)
All the labs are WNLs.

## 2019-06-03 ENCOUNTER — Other Ambulatory Visit: Payer: Self-pay | Admitting: Rheumatology

## 2019-06-03 DIAGNOSIS — L409 Psoriasis, unspecified: Secondary | ICD-10-CM

## 2019-06-03 DIAGNOSIS — L4050 Arthropathic psoriasis, unspecified: Secondary | ICD-10-CM

## 2019-06-03 NOTE — Telephone Encounter (Signed)
Last Visit: 05/14/2019 Next Visit: 10/29/2019 Labs: 05/14/2019 All the labs are WNLs. TB Gold: 11/04/2018 negative   Okay to refill per Dr. Corliss Skains.

## 2019-06-08 ENCOUNTER — Other Ambulatory Visit: Payer: Self-pay | Admitting: Rheumatology

## 2019-06-08 DIAGNOSIS — M1A0710 Idiopathic chronic gout, right ankle and foot, without tophus (tophi): Secondary | ICD-10-CM

## 2019-06-08 NOTE — Telephone Encounter (Signed)
Last Visit: 05/14/2019 Next Visit: 10/29/2019 Labs: 05/14/2019 WNL   Okay to refill per Dr.Deveshwar.

## 2019-06-23 ENCOUNTER — Ambulatory Visit: Payer: BC Managed Care – PPO | Attending: Internal Medicine

## 2019-06-23 DIAGNOSIS — Z23 Encounter for immunization: Secondary | ICD-10-CM

## 2019-06-23 NOTE — Progress Notes (Signed)
   Covid-19 Vaccination Clinic  Name:  Colin Jensen    MRN: [id number] DOB: 06-12-1985  06/23/2019  [name] was observed post Covid-19 immunization for 15 minutes without incident. He was provided with Vaccine Information Sheet and instruction to access the V-Safe system.   [name] was instructed to call 911 with any severe reactions post vaccine: Marland Kitchen Difficulty breathing  . Swelling of face and throat  . A fast heartbeat  . A bad rash all over body  . Dizziness and weakness   Immunizations Administered    Name Date Dose VIS Date Route   Pfizer COVID-19 Vaccine 06/23/2019  2:41 PM 0.3 mL 03/31/2018 Intramuscular   Manufacturer: [employer], [employer]   Lot: [id number]   NDC: [address]-4

## 2019-07-14 ENCOUNTER — Telehealth: Payer: Self-pay | Admitting: Pharmacist

## 2019-07-14 ENCOUNTER — Ambulatory Visit: Payer: BC Managed Care – PPO | Attending: Internal Medicine

## 2019-07-14 DIAGNOSIS — Z23 Encounter for immunization: Secondary | ICD-10-CM

## 2019-07-14 NOTE — Progress Notes (Signed)
   Covid-19 Vaccination Clinic  Name:  Colin Jensen    MRN: [id number] DOB: November 23, 1985  07/14/2019  Colin Jensen was observed post Covid-19 immunization for 15 minutes without incident. He was provided with Vaccine Information Sheet and instruction to access the V-Safe system.   Colin Jensen was instructed to call 911 with any severe reactions post vaccine: Marland Kitchen Difficulty breathing  . Swelling of face and throat  . A fast heartbeat  . A bad rash all over body  . Dizziness and weakness   Immunizations Administered    Name Date Dose VIS Date Route   Pfizer COVID-19 Vaccine 07/14/2019  9:25 AM 0.3 mL 03/31/2018 Intramuscular   Manufacturer: [employer], [employer]   Lot: [id number]   NDC: [address]-2

## 2019-07-14 NOTE — Telephone Encounter (Signed)
Received notification from [facility] regarding a prior authorization for COSENTYX. Authorization has been APPROVED from 07/10/2019 to 07/09/2020.   Authorization # PA # [facility] [id number]   Verlin Fester, PharmD, [place], CPP Clinical Specialty Pharmacist (Rheumatology and Pulmonology)  07/14/2019 12:01 PM

## 2019-07-22 ENCOUNTER — Telehealth: Payer: Self-pay | Admitting: Rheumatology

## 2019-07-22 ENCOUNTER — Other Ambulatory Visit: Payer: Self-pay | Admitting: Rheumatology

## 2019-07-22 NOTE — Telephone Encounter (Signed)
Prescription has been sent to the pharmacy. See rx refill note for details. Attempted to contact the patient and unable to leave a message mailbox is full.

## 2019-07-22 NOTE — Telephone Encounter (Signed)
Patient requesting a refill on Colchicine sent to [facility]. Today was his last dose.

## 2019-07-22 NOTE — Telephone Encounter (Signed)
Last Visit: 05/14/2019 Next Visit: 10/29/2019 Labs: 05/14/2019 All the labs are WNLs.  Current Dose per office note on 05/14/2019: colchicine 0.6 mg p.o. daily  Okay to refill per Dr. Corliss Skains

## 2019-09-01 ENCOUNTER — Other Ambulatory Visit: Payer: Self-pay | Admitting: Rheumatology

## 2019-09-01 DIAGNOSIS — M1A0710 Idiopathic chronic gout, right ankle and foot, without tophus (tophi): Secondary | ICD-10-CM

## 2019-09-01 NOTE — Telephone Encounter (Signed)
Last Visit: 05/14/2019 Next Visit: 10/29/2019 Labs: 05/14/2019 WNL   Current Dose per office note 05/14/2019: allopurinol 300 mg p.o. daily   Okay to refill per Dr. Corliss Skains

## 2019-10-29 ENCOUNTER — Ambulatory Visit: Payer: BC Managed Care – PPO | Admitting: Physician Assistant

## 2019-11-02 ENCOUNTER — Other Ambulatory Visit: Payer: Self-pay | Admitting: Rheumatology

## 2019-11-03 NOTE — Telephone Encounter (Signed)
Last Visit: 05/14/2019 Next Visit: 11/19/2019 Labs: 05/14/2019 - CBC, CMP, Uric Acid: All the labs are WNLs. Patient will be due to update labs at 11/19/2019 appointment.   Current Dose per office note 05/14/2019: Colchicine 0.6 mg p.o. 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 Corliss Skains.

## 2019-11-08 NOTE — Progress Notes (Signed)
Office Visit Note  Patient: Colin Jensen             Date of Birth: 27-Aug-1985           MRN: [id number]             PCP: Wendall Papa Referring: Wendall Papa, NP Visit Date: 11/19/2019 Occupation: @GUAROCC @  Subjective:  Medication monitoring   History of Present Illness: Colin Jensen is a 34 y.o. male with history of psoriatic arthritis and gout.  He is on Cosentyx 300 mg sq injections once every 28 days.  He has not missed any doses of cosentyx.  He denies any recent psoriatic arthritis flares.  He denies any joint pain or joint swelling at this time.  He denies any SI joint pain, achilles tendonitis, or plantar fasciitis. He has 1 small patch of psoriasis leading up to his monthly cosentyx injection which resolves after his injection.  He denies any recent gout flares. He is taking allopurinol 300 mg 1 tablet by mouth daily for management of gout. He has not needed to take colchicine since his last visit.   He denies any recent infections.  He has received both covid-19 vaccines and plans on receiving the 3rd dose.     Activities of Daily Living:  Patient reports morning stiffness for  15 minutes.   Patient Denies nocturnal pain.  Difficulty dressing/grooming: Denies Difficulty climbing stairs: Denies Difficulty getting out of chair: Denies Difficulty using hands for taps, buttons, cutlery, and/or writing: Denies  Review of Systems  Constitutional: Negative for fatigue and night sweats.  HENT: Negative for mouth sores, mouth dryness and nose dryness.   Eyes: Negative for redness and dryness.  Respiratory: Negative for shortness of breath and difficulty breathing.   Cardiovascular: Negative for chest pain, palpitations, hypertension, irregular heartbeat and swelling in legs/feet.  Gastrointestinal: Negative for constipation and diarrhea.  Endocrine: Negative for increased urination.  Genitourinary: Negative for painful urination.  Musculoskeletal: Positive for  myalgias, morning stiffness, muscle tenderness and myalgias. Negative for arthralgias, joint pain, joint swelling and muscle weakness.  Skin: Negative for color change, rash, hair loss, nodules/bumps, skin tightness, ulcers and sensitivity to sunlight.  Allergic/Immunologic: Negative for susceptible to infections.  Neurological: Negative for dizziness, fainting, memory loss, night sweats and weakness.  Hematological: Negative for swollen glands.  Psychiatric/Behavioral: Negative for depressed mood, confusion and sleep disturbance. The patient is not nervous/anxious.     PMFS History:  Patient Active Problem List   Diagnosis Date Noted  . History of obesity 05/01/2016  . Psoriatic arthritis (HCC) 12/06/2015  . Psoriasis 12/06/2015  . High risk medication use 12/06/2015    Past Medical History:  Diagnosis Date  . Gout   . Psoriatic arthritis (HCC)     Family History  Problem Relation Age of Onset  . Heart attack Father   . Psoriasis Father   . Diabetes Sister   . Healthy Son    History reviewed. No pertinent surgical history. Social History   Social History Narrative  . Not on file   Immunization History  Administered Date(s) Administered  . Influenza Inj Mdck Quad With Preservative 01/23/2018  . PFIZER SARS-COV-2 Vaccination 06/23/2019, 07/14/2019  . Tdap 01/23/2018     Objective: Vital Signs: BP 115/75 (BP Location: Left Arm, Patient Position: Sitting, Cuff Size: Normal)   Pulse 65   Resp 15   Ht 5\' 11"  (1.803 m)   Wt 240 lb 9.6 oz (109.1 kg)   BMI 33.56  kg/m    Physical Exam Vitals and nursing note reviewed.  Constitutional:      Appearance: He is well-developed.  HENT:     Head: Normocephalic and atraumatic.  Eyes:     Conjunctiva/sclera: Conjunctivae normal.     Pupils: Pupils are equal, round, and reactive to light.  Pulmonary:     Effort: Pulmonary effort is normal.  Abdominal:     Palpations: Abdomen is soft.  Musculoskeletal:     Cervical back:  Normal range of motion and neck supple.  Skin:    General: Skin is warm and dry.     Capillary Refill: Capillary refill takes less than 2 seconds.  Neurological:     Mental Status: He is alert and oriented to person, place, and time.  Psychiatric:        Behavior: Behavior normal.      Musculoskeletal Exam: C-spine, thoracic spine, and lumbar spine good ROM.  Shoulder joints, elbow joints, wrist joints, MCPs, PIPs, and DIPs good ROM with no synovitis.  Complete fist formation bilaterally.  Hip joints, knee joints, ankle joints, MTPs, PIPs, and DIPs good ROM with no synovitis.  No warmth or effusion of knee joints.  No tenderness or swelling of ankle joints.    CDAI Exam: CDAI Score: -- Patient Global: --; Provider Global: -- Swollen: --; Tender: -- Joint Exam 11/19/2019   No joint exam has been documented for this visit   There is currently no information documented on the homunculus. Go to the Rheumatology activity and complete the homunculus joint exam.  Investigation: No additional findings.  Imaging: No results found.  Recent Labs: Lab Results  Component Value Date   WBC 3.8 05/14/2019   HGB 15.4 05/14/2019   PLT 182 05/14/2019   NA 140 05/14/2019   K 4.2 05/14/2019   CL 103 05/14/2019   CO2 30 05/14/2019   GLUCOSE 84 05/14/2019   BUN 18 05/14/2019   CREATININE 0.82 05/14/2019   BILITOT 0.7 05/14/2019   ALKPHOS 46 05/07/2016   AST 24 05/14/2019   ALT 28 05/14/2019   PROT 7.2 05/14/2019   ALBUMIN 4.2 05/07/2016   CALCIUM 9.7 05/14/2019   GFRAA 133 05/14/2019   QFTBGOLDPLUS NEGATIVE 11/04/2018    Speciality Comments: No specialty comments available.  Procedures:  No procedures performed Allergies: Nickel, Ibuprofen, and Penicillins   Assessment / Plan:     Visit Diagnoses: Psoriatic arthritis (HCC): She has no synovitis or dactylitis on exam.  He has not had any recent psoriatic arthritis flares.  He is clinically doing well on Cosentyx 300 mg  subcutaneous injections every 28 days.  He has not missed any doses of Cosentyx recently.  He has not had any recent infections.  He is not experiencing any joint pain or inflammation at this time.  His morning stiffness has been lasting for about 15 minutes.  He is not experiencing any SI joint discomfort at this time.  He has no Achilles tendinitis or plantar fasciitis.  He will continue on Cosentyx 300 mg subcutaneous injections every 28 days.  He does not need a refill at this time.  He was advised to notify us if he develops increased joint pain or joint swelling.  He will follow-up in the office in 5 months.  Psoriasis: He has a small patch of psoriasis on the extensor surface of the left elbow several days prior to his monthly cosentyx injection, which resolves after administering the injection.  He has no active psoriasis at this  time.   High risk medication use - Cosentyx 300 mg sq injection every 28 days (started on 08/06/18). - Plan: QuantiFERON-TB Gold Plus, CBC with Differential/Platelet, COMPLETE METABOLIC PANEL WITH GFR CBC and CMP WNL on 05/14/19.  He is overdue to update lab work.  Orders for CBC and CMP were released today.  He will be due for labs again in January and every 3 months to monitor for drug toxicity.  TB gold negative on 11/04/18.  He is overdue to update TB gold.  Order for TB gold released. He has received both covid-19 vaccine doses and was encouraged to receive the 3rd dose.   He was advised to avoid taking NSAIDs and tylenol 24 hours prior to the 3rd dose. He was advised to notify us or his PCP if he develops the covid-19 infection in order to receive the monoclonal antibody infusion.  He was encouraged to continue to wear a mask and social distance.  He voiced understanding.  He was advised to hold cosentyx if he develops signs or symptoms of an infection and to resume once the infection has completely cleared.  Idiopathic chronic gout of right ankle without tophus- He is  taking allopurinol 300 mg 1 tablet by mouth daily for management of gout.  His uric acid level was 5.3 on 05/14/19.  We will check his uric acid level today.  He has avoided eating red meat for the past 1 year. - Plan: Uric acid  Screening for tuberculosis - TB gold negative on 11/04/18.  Order for TB gold released today. Plan: QuantiFERON-TB Gold Plus  Orders: Orders Placed This Encounter  Procedures  . QuantiFERON-TB Gold Plus  . Uric acid  . CBC with Differential/Platelet  . COMPLETE METABOLIC PANEL WITH GFR   No orders of the defined types were placed in this encounter.    Follow-Up Instructions: Return in about 5 months (around 04/18/2020) for Psoriatic arthritis, Gout.   Gearldine Bienenstock, PA-C  Note - This record has been created using Dragon software.  Chart creation errors have been sought, but may not always  have been located. Such creation errors do not reflect on  the standard of medical care.

## 2019-11-19 ENCOUNTER — Ambulatory Visit: Payer: BC Managed Care – PPO | Admitting: Physician Assistant

## 2019-11-19 ENCOUNTER — Other Ambulatory Visit: Payer: Self-pay

## 2019-11-19 ENCOUNTER — Encounter: Payer: Self-pay | Admitting: Physician Assistant

## 2019-11-19 VITALS — BP 115/75 | HR 65 | Resp 15 | Ht 71.0 in | Wt 240.6 lb

## 2019-11-19 DIAGNOSIS — L409 Psoriasis, unspecified: Secondary | ICD-10-CM | POA: Diagnosis not present

## 2019-11-19 DIAGNOSIS — Z79899 Other long term (current) drug therapy: Secondary | ICD-10-CM

## 2019-11-19 DIAGNOSIS — M1A0710 Idiopathic chronic gout, right ankle and foot, without tophus (tophi): Secondary | ICD-10-CM | POA: Diagnosis not present

## 2019-11-19 DIAGNOSIS — L4050 Arthropathic psoriasis, unspecified: Secondary | ICD-10-CM | POA: Diagnosis not present

## 2019-11-19 DIAGNOSIS — Z111 Encounter for screening for respiratory tuberculosis: Secondary | ICD-10-CM

## 2019-11-19 NOTE — Patient Instructions (Signed)
COVID-19 vaccine recommendations:   COVID-19 vaccine is recommended for everyone (unless you are allergic to a vaccine component), even if you are on a medication that suppresses your immune system.   If you are on Methotrexate, Cellcept (mycophenolate), Rinvoq, Xeljanz, and Olumiant- hold the medication for 1 week after each vaccine. Hold Methotrexate for 2 weeks after the single dose COVID-19 vaccine.   If you are on Orencia subcutaneous injection - hold medication one week prior to and one week after the first COVID-19 vaccine dose (only).   If you are on Orencia IV infusions- time vaccination administration so that the first COVID-19 vaccination will occur four weeks after the infusion and postpone the subsequent infusion by one week.   If you are on Cyclophosphamide or Rituxan infusions please contact your doctor prior to receiving the COVID-19 vaccine.   Do not take Tylenol or any anti-inflammatory medications (NSAIDs) 24 hours prior to the COVID-19 vaccination.   There is no direct evidence about the efficacy of the COVID-19 vaccine in individuals who are on medications that suppress the immune system.   Even if you are fully vaccinated, and you are on any medications that suppress your immune system, please continue to wear a mask, maintain at least six feet social distance and practice hand hygiene.   If you develop a COVID-19 infection, please contact your PCP or our office to determine if you need antibody infusion.  The booster vaccine is now available for immunocompromised patients. It is advised that if you had Pfizer vaccine you should get Pfizer booster.  If you had a Moderna vaccine then you should get a Moderna booster. Johnson and Johnson does not have a booster vaccine at this time.  Please see the following web sites for updated information.    [email or web address]://[email or web address]/[email or web address]-Vaccination-Patient-Resources.[email or web address]  [email or web address]://[email or web address]-[email or web address]-[email or web address]/[email or web address]  Standing Labs We placed an order today for your standing lab work.   Please have your standing labs drawn in January and every 3 months   If possible, please have your labs drawn 2 weeks prior to your appointment so that the provider can discuss your results at your appointment.  We have open lab daily Monday through Thursday from 8:30-12:30 PM and 1:30-4:30 PM and Friday from 8:30-12:30 PM and 1:30-4:00 PM at the office of Dr. Shaili Deveshwar, [facility].   Please be advised, patients with office appointments requiring lab work will take precedents over walk-in lab work.  If possible, please come for your lab work on Monday and Friday afternoons, as you may experience shorter wait times. The office is located at [address], [address], [place],  [address] No appointment is necessary.   Labs are drawn by [facility]. Please bring your co-pay at the time of your lab draw.  You may receive a bill from [facility] for your lab work.  If you wish to have your labs drawn at another location, please call the office 24 hours in advance to send orders.  If you have any questions regarding directions or hours of operation,  please call [phone].   As a reminder, please drink plenty of water prior to coming for your lab work. Thanks!   

## 2019-11-21 LAB — CBC WITH DIFFERENTIAL/PLATELET
Absolute Monocytes: 340 cells/uL (ref 200–950)
Basophils Absolute: 62 cells/uL (ref 0–200)
Basophils Relative: 1.5 %
Eosinophils Absolute: 111 cells/uL (ref 15–500)
Eosinophils Relative: 2.7 %
HCT: 44.3 % (ref 38.5–50.0)
Hemoglobin: 15.1 g/dL (ref 13.2–17.1)
Lymphs Abs: 1353 cells/uL (ref 850–3900)
MCH: 31.4 pg (ref 27.0–33.0)
MCHC: 34.1 g/dL (ref 32.0–36.0)
MCV: 92.1 fL (ref 80.0–100.0)
MPV: 10.7 fL (ref 7.5–12.5)
Monocytes Relative: 8.3 %
Neutro Abs: 2235 cells/uL (ref 1500–7800)
Neutrophils Relative %: 54.5 %
Platelets: 177 10*3/uL (ref 140–400)
RBC: 4.81 10*6/uL (ref 4.20–5.80)
RDW: 12.7 % (ref 11.0–15.0)
Total Lymphocyte: 33 %
WBC: 4.1 10*3/uL (ref 3.8–10.8)

## 2019-11-21 LAB — COMPLETE METABOLIC PANEL WITH GFR
AG Ratio: 1.8 (calc) (ref 1.0–2.5)
ALT: 28 U/L (ref 9–46)
AST: 23 U/L (ref 10–40)
Albumin: 4.4 g/dL (ref 3.6–5.1)
Alkaline phosphatase (APISO): 50 U/L (ref 36–130)
BUN: 14 mg/dL (ref 7–25)
CO2: 29 mmol/L (ref 20–32)
Calcium: 9.3 mg/dL (ref 8.6–10.3)
Chloride: 103 mmol/L (ref 98–110)
Creat: 0.75 mg/dL (ref 0.60–1.35)
GFR, Est African American: 137 mL/min/{1.73_m2} (ref >=60)
GFR, Est Non African American: 118 mL/min/{1.73_m2} (ref >=60)
Globulin: 2.5 g/dL (calc) (ref 1.9–3.7)
Glucose, Bld: 88 mg/dL (ref 65–99)
Potassium: 4.3 mmol/L (ref 3.5–5.3)
Sodium: 139 mmol/L (ref 135–146)
Total Bilirubin: 0.8 mg/dL (ref 0.2–1.2)
Total Protein: 6.9 g/dL (ref 6.1–8.1)

## 2019-11-21 LAB — URIC ACID: Uric Acid, Serum: 5.6 mg/dL (ref 4.0–8.0)

## 2019-11-21 LAB — QUANTIFERON-TB GOLD PLUS
Mitogen-NIL: >10 IU/mL
NIL: 0.04 IU/mL
QuantiFERON-TB Gold Plus: NEGATIVE
TB1-NIL: <0 IU/mL
TB2-NIL: 0 IU/mL

## 2019-11-22 NOTE — Progress Notes (Signed)
CBC and CMP WNL.  TB gold negative. Uric acid is within the desirable range.  No change in therapy recommended.

## 2019-12-06 ENCOUNTER — Other Ambulatory Visit: Payer: Self-pay | Admitting: Rheumatology

## 2019-12-06 DIAGNOSIS — M1A0710 Idiopathic chronic gout, right ankle and foot, without tophus (tophi): Secondary | ICD-10-CM

## 2019-12-06 NOTE — Telephone Encounter (Signed)
Last Visit: 11/19/2019 Next Visit:04/21/2020 Labs: 11/19/2019 CBC and CMP WNL. TB gold negative. Uric acid is within the desirable range.  No change in therapy recommended.   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

## 2019-12-07 ENCOUNTER — Other Ambulatory Visit: Payer: Self-pay | Admitting: Rheumatology

## 2019-12-07 DIAGNOSIS — L4050 Arthropathic psoriasis, unspecified: Secondary | ICD-10-CM

## 2019-12-07 DIAGNOSIS — L409 Psoriasis, unspecified: Secondary | ICD-10-CM

## 2019-12-07 NOTE — Telephone Encounter (Signed)
Last Visit: 11/19/2019 Next Visit: 04/21/2020 Labs: 11/19/2019 CBC and CMP WNL TB Gold: 11/19/2019  Current Dose per office note 11/19/2019: Cosentyx 300 mg sq injection every 28 days   DX: Psoriatic arthritis   Okay to refill per Dr. Corliss Skains

## 2020-01-12 ENCOUNTER — Other Ambulatory Visit: Payer: Self-pay | Admitting: Rheumatology

## 2020-01-13 NOTE — Telephone Encounter (Signed)
Last Visit: 11/19/2019 Next Visit: 04/21/2020 Labs: 11/19/2019 CBC and CMP WNL  Current Dose per office note on 11/19/2019: not specified.  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

## 2020-02-02 ENCOUNTER — Telehealth: Payer: Self-pay

## 2020-02-02 NOTE — Telephone Encounter (Signed)
Patient called stating he saw his chiropractor last week.  Patient states he is experiencing neck and head pain with tingling in his left hand pinky finger.  Patient states he also has "a spot on his scalp which is tingling."  Patient states his Chiropractor wanted him to inform Dr. Corliss Skains of the symptoms and possibly get labwork.  Patient states "he thinks the pain could be coming from nerve pain" and requested a return call.

## 2020-02-02 NOTE — Telephone Encounter (Signed)
He will require an appointment for further evaluation or we can refer him to a neurologist for further evaluation.

## 2020-02-03 NOTE — Telephone Encounter (Signed)
Attempted to contact the patient and left message for patient to call the office.  

## 2020-02-03 NOTE — Telephone Encounter (Signed)
Patient states he saw his primary care yesterday afternoon and the PCP advised him it is shingles related. Patient was given 2 prescriptions. Patient states he will follow recommendations by PCP and take prescriptions as prescribed. Patient will contact the office if he continues to have the issues and will schedule an appointment.

## 2020-04-07 NOTE — Progress Notes (Signed)
Office Visit Note  Patient: Colin Jensen             Date of Birth: 05/28/1985           MRN: [id number]             PCP: Juliette Alcide, MD Referring: Wendall Papa, NP Visit Date: 04/21/2020 Occupation: @GUAROCC @  Subjective:  Neuralgias   History of Present Illness: Colin Jensen is a 35 y.o. male with history of psoriatic arthritis and gout.  He is on cosentyx 300 mg sq injections every 28 days. He has not missed any doses recently.  He denies any recent psoriatic arthritis flares.  Patient reports that a few days leading up to his Cosentyx injections he noticed some increased pain and stiffness in both hands but denies any joint swelling.  He is currently experiencing plantar fasciitis in the left foot but has been performing stretching exercises on a daily basis.  He denies any Achilles tendinitis.  He has occasional left SI joint discomfort which is typically alleviated by massage.  He continues to see a chiropractor on a regular basis.  He denies any active psoriasis at this time. Patient reports that he was diagnosed with an ear infection/sinus infection in December 2021.  He was treated with prednisone and an antibiotics.  He states once completing both prescriptions he developed 2 blister-like lesions on the left side of his scalp.  He was evaluated by his PCP and was initially diagnosed with shingles and given a prescription for valtrex.  He continued to have neuralgias on his scalp, left arm, and intermittently on his left thigh.  He was evaluated by another provider who diagnosed him with anxiety and depression.  He has recently established care with a new PCP and has started to talk with a clinical psychologist, which has been helpful.  He had x-rays of the neck and according to the patient his C-spine was 'straighter than it should be."   He continues to experience episodic numbness and neuralgias.  He continues to have left trapezius muscle tension and tenderness.  He was  given a prescription of flexeril but he has not tried it yet due to the concern for possible side effects. He is concerned to return to weightlifting due to the discomfort in his neck and left shoulder.       Activities of Daily Living:  Patient reports morning stiffness for 0 minutes.   Patient Denies nocturnal pain.  Difficulty dressing/grooming: Denies Difficulty climbing stairs: Denies Difficulty getting out of chair: Denies Difficulty using hands for taps, buttons, cutlery, and/or writing: Denies  Review of Systems  Constitutional: Positive for fatigue.  HENT: Negative for mouth sores, mouth dryness and nose dryness.   Eyes: Negative for pain, itching and dryness.  Respiratory: Negative for shortness of breath and difficulty breathing.   Cardiovascular: Negative for chest pain and palpitations.  Gastrointestinal: Negative for blood in stool, constipation and diarrhea.  Endocrine: Negative for increased urination.  Genitourinary: Negative for difficulty urinating.  Musculoskeletal: Positive for myalgias and myalgias. Negative for arthralgias, joint pain, joint swelling, morning stiffness and muscle tenderness.  Skin: Negative for color change, rash and redness.  Allergic/Immunologic: Negative for susceptible to infections.  Neurological: Positive for numbness and headaches. Negative for dizziness, memory loss and weakness.  Hematological: Negative for bruising/bleeding tendency.  Psychiatric/Behavioral: Negative for depressed mood and confusion. The patient is nervous/anxious.     PMFS History:  Patient Active Problem List   Diagnosis Date  Noted  . History of obesity 05/01/2016  . Psoriatic arthritis (HCC) 12/06/2015  . Psoriasis 12/06/2015  . High risk medication use 12/06/2015    Past Medical History:  Diagnosis Date  . Gout   . Psoriatic arthritis (HCC)     Family History  Problem Relation Age of Onset  . Heart attack Father   . Psoriasis Father   . Diabetes  Sister   . Healthy Son    History reviewed. No pertinent surgical history. Social History   Social History Narrative  . Not on file   Immunization History  Administered Date(s) Administered  . Influenza Inj Mdck Quad With Preservative 01/23/2018  . PFIZER(Purple Top)SARS-COV-2 Vaccination 06/23/2019, 07/14/2019  . Tdap 01/23/2018     Objective: Vital Signs: BP 128/88 (BP Location: Left Arm, Patient Position: Sitting, Cuff Size: Normal)   Pulse 75   Resp 16   Ht 5\' 11"  (1.803 m)   Wt 235 lb (106.6 kg)   BMI 32.78 kg/m    Physical Exam Vitals and nursing note reviewed.  Constitutional:      Appearance: He is well-developed.  HENT:     Head: Normocephalic and atraumatic.  Eyes:     Conjunctiva/sclera: Conjunctivae normal.     Pupils: Pupils are equal, round, and reactive to light.  Pulmonary:     Effort: Pulmonary effort is normal.  Abdominal:     Palpations: Abdomen is soft.  Musculoskeletal:     Cervical back: Normal range of motion and neck supple.  Skin:    General: Skin is warm and dry.     Capillary Refill: Capillary refill takes less than 2 seconds.  Neurological:     Mental Status: He is alert and oriented to person, place, and time.  Psychiatric:        Behavior: Behavior normal.      Musculoskeletal Exam: C-spine, thoracic spine, and lumbar spine have good range of motion with no discomfort.  Tenderness over the left trapezius muscle.  Right shoulder has good range of motion with no discomfort.  Left shoulder has good range of motion with some discomfort.  Elbow joints, wrist joints, MCPs, PIPs, DIPs have good range of motion with no synovitis.  Complete fist formation bilaterally.  Hip joints have good range of motion with no discomfort.  Knee joints have good range of motion with no warmth or effusion.  Ankle joints have good range of motion with no tenderness or inflammation.  No Achilles tendinitis.  Left plantar fasciitis.  CDAI Exam: CDAI Score:  -- Patient Global: --; Provider Global: -- Swollen: --; Tender: -- Joint Exam 04/21/2020   No joint exam has been documented for this visit   There is currently no information documented on the homunculus. Go to the Rheumatology activity and complete the homunculus joint exam.  Investigation: No additional findings.  Imaging: No results found.  Recent Labs: Lab Results  Component Value Date   WBC 4.1 11/19/2019   HGB 15.1 11/19/2019   PLT 177 11/19/2019   NA 139 11/19/2019   K 4.3 11/19/2019   CL 103 11/19/2019   CO2 29 11/19/2019   GLUCOSE 88 11/19/2019   BUN 14 11/19/2019   CREATININE 0.75 11/19/2019   BILITOT 0.8 11/19/2019   ALKPHOS 46 05/07/2016   AST 23 11/19/2019   ALT 28 11/19/2019   PROT 6.9 11/19/2019   ALBUMIN 4.2 05/07/2016   CALCIUM 9.3 11/19/2019   GFRAA 137 11/19/2019   QFTBGOLDPLUS NEGATIVE 11/19/2019    Speciality  Comments: No specialty comments available.  Procedures:  No procedures performed Allergies: Nickel, Ibuprofen, and Penicillins     Assessment / Plan:     Visit Diagnoses: Psoriatic arthritis (HCC): He has no synovitis or dactylitis on exam.  He has not had any recent psoriatic arthritis flares.  He is clinically doing well on Cosentyx 300 mg subcutaneous injections every 28 days.  He has not missed any doses of Cosentyx recently.  He experiences some increased discomfort and stiffness in both hands a few days before his monthly Cosentyx injections but has not noticed any inflammation.  He experiences intermittent discomfort in his left SI joint which is typically alleviated by massage and stretching exercises.  He has started to perform yoga on a regular basis.  He is also been experiencing discomfort secondary to plantar fasciitis in the left foot but has been performing stretching exercises daily which has started to alleviate his discomfort.  He will continue on Cosentyx as prescribed.  He was advised to notify us if he develops increased  joint pain or joint swelling.  He will follow-up in the office in 5 months.  Psoriasis: He has no active psoriasis at this time.   High risk medication use - Cosentyx 300 mg sq injection every 28 days (started on 08/06/18). CBC and CMP drawn on 11/19/19.  He is overdue to update lab work.  Orders for CBC and CMP were released.  His next lab work will be due in June and every 3 months.  TB gold negative on 11/19/19 and will continue to be monitored yearly.  - Plan: CBC with Differential/Platelet, COMPLETE METABOLIC PANEL WITH GFR, CBC with Differential/Platelet, COMPLETE METABOLIC PANEL WITH GFR Advised to hold Cosentyx if he develops signs or symptoms of an infection and to resume once infection has completely cleared.   Idiopathic chronic gout of right ankle without tophus -He has not had any recent gout flares.  He is clinically doing well taking allopurinol 300 mg 1 tablet by mouth daily.he is tolerating allopurinol without any side effects.  He has a prescription for colchicine which she takes as needed.  He is unsure when the last time he had to take colchicine was.  Uric acid was 5.6 on 11/19/19, which is within the desirable range.  Due to update uric acid level.  He will continue taking allopurinol as prescribed.  A refill was sent to the pharmacy.  He was advised to notify us if he develops signs or symptoms of a gout flare.- Plan: Uric acid, allopurinol (ZYLOPRIM) 300 MG tablet  Vitamin D deficiency -He is taking vitamin D 4,000 units daily.  Patient requested to have vitamin D checked today. Plan: VITAMIN D 25 Hydroxy (Vit-D Deficiency, Fractures)  Trapezius muscle spasm: He is experiencing left trapezius muscle tension and muscle tenderness.  He has been experiencing muscle spasms intermittently.  He had a C-spine x-ray by his PCP but has not had any further imaging.  He has been experiencing intermittent neuralgias and numbness on the left side of his scalp and left arm.  Referral to neurology  will be placed today for further evaluation and management.  Neuralgia: He has been experiencing intermittent neuralgias and numbness on the left side of his scalp, left arm, and left thigh since December 2021.  He had a C-spine x-ray performed by his PCP and according to the patient his neck was straighter than it should be.  He was given a prescription for Flexeril but he has not tried taking  it due to the concern for side effects.  A referral to neurology will be placed today for further evaluation and management.  The patient was advised to avoid weight lifting exercises until he has been evaluated by neurology.  Orders: Orders Placed This Encounter  Procedures  . CBC with Differential/Platelet  . COMPLETE METABOLIC PANEL WITH GFR  . Uric acid  . CBC with Differential/Platelet  . COMPLETE METABOLIC PANEL WITH GFR  . VITAMIN D 25 Hydroxy (Vit-D Deficiency, Fractures)  . Ambulatory referral to Neurology   Meds ordered this encounter  Medications  . allopurinol (ZYLOPRIM) 300 MG tablet    Sig: Take 1 tablet (300 mg total) by mouth daily.    Dispense:  90 tablet    Refill:  0     Follow-Up Instructions: Return in about 5 months (around 09/21/2020) for Psoriatic arthritis, Gout.   Gearldine Bienenstock, PA-C  Note - This record has been created using Dragon software.  Chart creation errors have been sought, but may not always  have been located. Such creation errors do not reflect on  the standard of medical care.

## 2020-04-21 ENCOUNTER — Encounter: Payer: Self-pay | Admitting: Physician Assistant

## 2020-04-21 ENCOUNTER — Other Ambulatory Visit: Payer: Self-pay

## 2020-04-21 ENCOUNTER — Ambulatory Visit (INDEPENDENT_AMBULATORY_CARE_PROVIDER_SITE_OTHER): Payer: BC Managed Care – PPO | Admitting: Physician Assistant

## 2020-04-21 VITALS — BP 128/88 | HR 75 | Resp 16 | Ht 71.0 in | Wt 235.0 lb

## 2020-04-21 DIAGNOSIS — L4050 Arthropathic psoriasis, unspecified: Secondary | ICD-10-CM

## 2020-04-21 DIAGNOSIS — L409 Psoriasis, unspecified: Secondary | ICD-10-CM | POA: Diagnosis not present

## 2020-04-21 DIAGNOSIS — M1A0710 Idiopathic chronic gout, right ankle and foot, without tophus (tophi): Secondary | ICD-10-CM | POA: Diagnosis not present

## 2020-04-21 DIAGNOSIS — Z79899 Other long term (current) drug therapy: Secondary | ICD-10-CM

## 2020-04-21 DIAGNOSIS — M62838 Other muscle spasm: Secondary | ICD-10-CM

## 2020-04-21 DIAGNOSIS — E559 Vitamin D deficiency, unspecified: Secondary | ICD-10-CM

## 2020-04-21 DIAGNOSIS — M792 Neuralgia and neuritis, unspecified: Secondary | ICD-10-CM

## 2020-04-21 MED ORDER — ALLOPURINOL 300 MG PO TABS: 300.0000 mg | ORAL_TABLET | Freq: Every day | ORAL | 0 refills | Status: DC

## 2020-04-22 LAB — CBC WITH DIFFERENTIAL/PLATELET
Absolute Monocytes: 502 cells/uL (ref 200–950)
Basophils Absolute: 59 cells/uL (ref 0–200)
Basophils Relative: 1 %
Eosinophils Absolute: 130 cells/uL (ref 15–500)
Eosinophils Relative: 2.2 %
HCT: 45.2 % (ref 38.5–50.0)
Hemoglobin: 15.5 g/dL (ref 13.2–17.1)
Lymphs Abs: 1198 cells/uL (ref 850–3900)
MCH: 32 pg (ref 27.0–33.0)
MCHC: 34.3 g/dL (ref 32.0–36.0)
MCV: 93.4 fL (ref 80.0–100.0)
MPV: 11 fL (ref 7.5–12.5)
Monocytes Relative: 8.5 %
Neutro Abs: 4012 cells/uL (ref 1500–7800)
Neutrophils Relative %: 68 %
Platelets: 191 10*3/uL (ref 140–400)
RBC: 4.84 10*6/uL (ref 4.20–5.80)
RDW: 12.3 % (ref 11.0–15.0)
Total Lymphocyte: 20.3 %
WBC: 5.9 10*3/uL (ref 3.8–10.8)

## 2020-04-22 LAB — COMPLETE METABOLIC PANEL WITH GFR
AG Ratio: 2 (calc) (ref 1.0–2.5)
ALT: 35 U/L (ref 9–46)
AST: 24 U/L (ref 10–40)
Albumin: 4.7 g/dL (ref 3.6–5.1)
Alkaline phosphatase (APISO): 58 U/L (ref 36–130)
BUN: 13 mg/dL (ref 7–25)
CO2: 30 mmol/L (ref 20–32)
Calcium: 9.5 mg/dL (ref 8.6–10.3)
Chloride: 102 mmol/L (ref 98–110)
Creat: 0.64 mg/dL (ref 0.60–1.35)
GFR, Est African American: 146 mL/min/{1.73_m2} (ref >=60)
GFR, Est Non African American: 126 mL/min/{1.73_m2} (ref >=60)
Globulin: 2.4 g/dL (calc) (ref 1.9–3.7)
Glucose, Bld: 83 mg/dL (ref 65–99)
Potassium: 3.9 mmol/L (ref 3.5–5.3)
Sodium: 140 mmol/L (ref 135–146)
Total Bilirubin: 1 mg/dL (ref 0.2–1.2)
Total Protein: 7.1 g/dL (ref 6.1–8.1)

## 2020-04-22 LAB — VITAMIN D 25 HYDROXY (VIT D DEFICIENCY, FRACTURES): Vit D, 25-Hydroxy: 38 ng/mL (ref 30–100)

## 2020-04-22 LAB — URIC ACID: Uric Acid, Serum: 4.6 mg/dL (ref 4.0–8.0)

## 2020-04-24 NOTE — Progress Notes (Signed)
CBC and CMP WNL.  Uric acid is WNL.  Continue current dose of allopurinol. Vitamin D is WNL. Continue maintenance dose of vitamin D.

## 2020-06-06 ENCOUNTER — Other Ambulatory Visit: Payer: Self-pay | Admitting: Family Medicine

## 2020-06-06 DIAGNOSIS — M5412 Radiculopathy, cervical region: Secondary | ICD-10-CM

## 2020-06-10 ENCOUNTER — Other Ambulatory Visit: Payer: Self-pay

## 2020-06-10 ENCOUNTER — Ambulatory Visit
Admission: RE | Admit: 2020-06-10 | Discharge: 2020-06-10 | Disposition: A | Discharge status: Home / Self Care | Payer: No Typology Code available for payment source | Source: Ambulatory Visit | Attending: Family Medicine | Admitting: Family Medicine

## 2020-06-10 DIAGNOSIS — M5412 Radiculopathy, cervical region: Secondary | ICD-10-CM

## 2020-06-10 IMAGING — MR MR CERVICAL SPINE W/O CM
2 series · 32 of 32 positions shown · non-contrast
Comparison: None.

CLINICAL DATA: Five months bilateral neck pain. Left arm pain and
leg weakness.

EXAM:
MRI CERVICAL SPINE WITHOUT CONTRAST
TECHNIQUE: Multiplanar, multisequence MR imaging of the cervical spine was
performed. No intravenous contrast was administered.

[Series 5: T2 · sagittal · 3.0mm · 0.82mm/px · 16 of 16 slices shown]
[im 1/16]
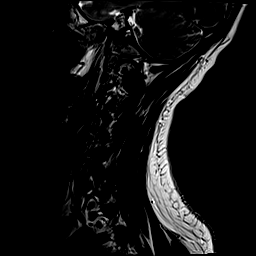
[im 2/16]
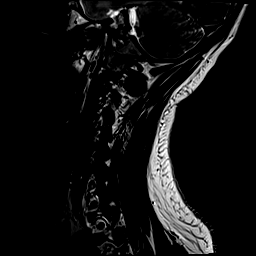
[im 3/16]
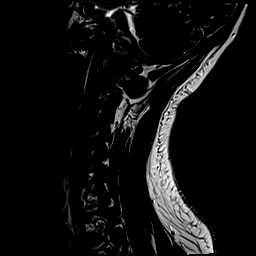
[im 4/16]
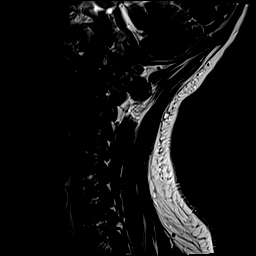
[im 5/16]
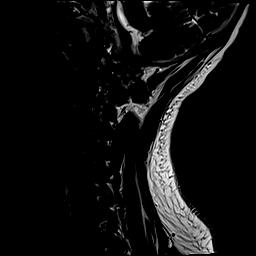
[im 6/16]
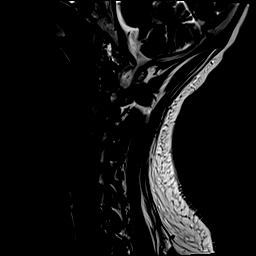
[im 7/16]
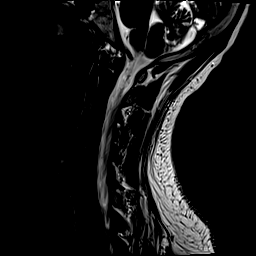
[im 8/16]
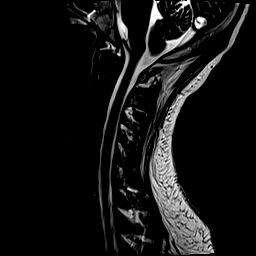
[im 9/16]
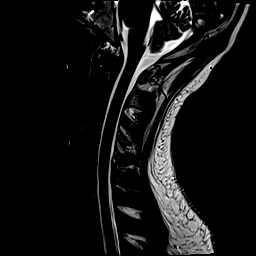
[im 10/16]
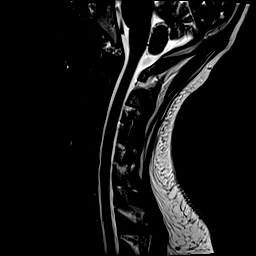
[im 11/16]
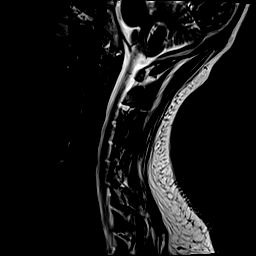
[im 12/16]
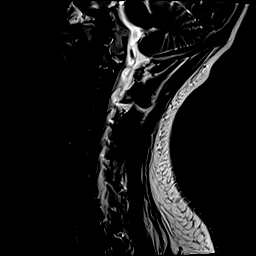
[im 13/16]
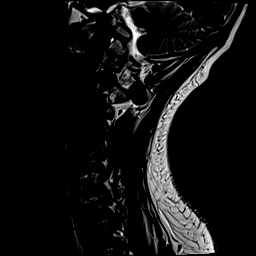
[im 14/16]
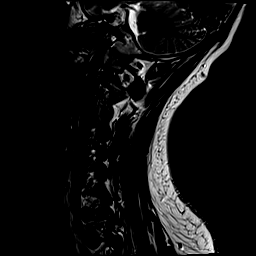
[im 15/16]
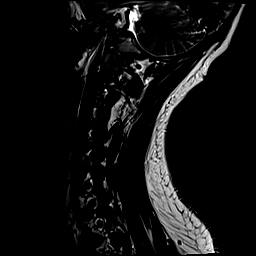
[im 16/16]
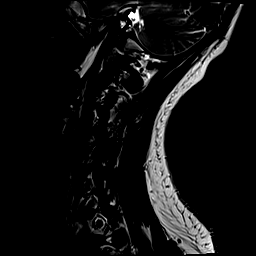

[Series 6: T1 · sagittal · 3.0mm · 0.82mm/px · 16 of 16 slices shown]
[im 1/16]
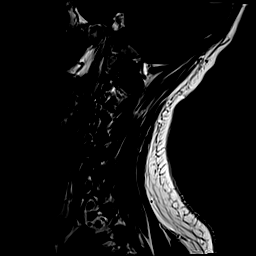
[im 2/16]
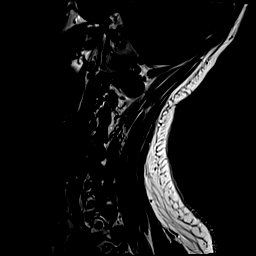
[im 3/16]
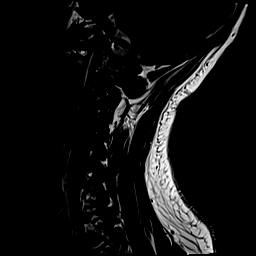
[im 4/16]
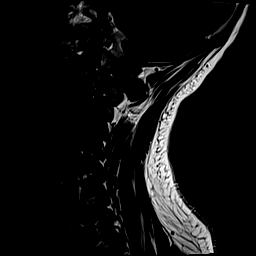
[im 5/16]
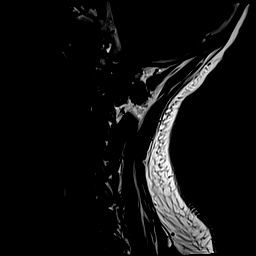
[im 6/16]
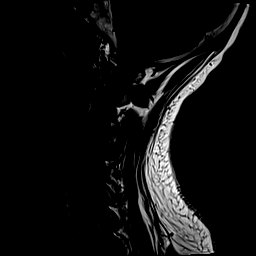
[im 7/16]
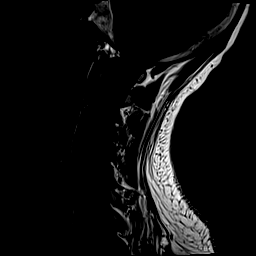
[im 8/16]
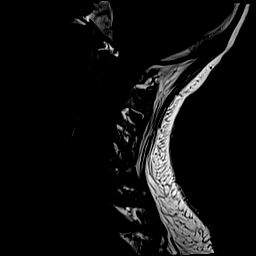
[im 9/16]
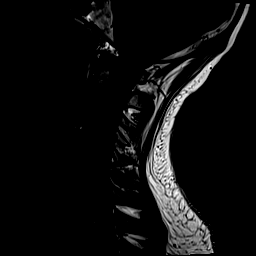
[im 10/16]
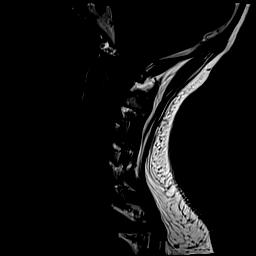
[im 11/16]
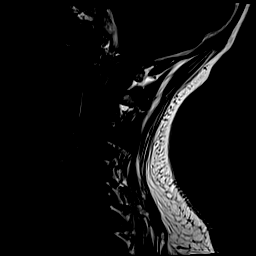
[im 12/16]
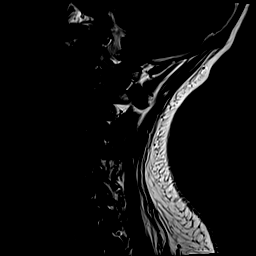
[im 13/16]
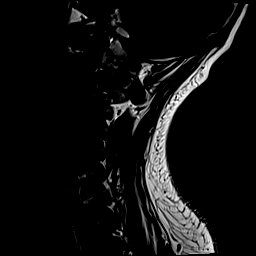
[im 14/16]
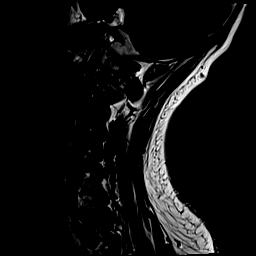
[im 15/16]
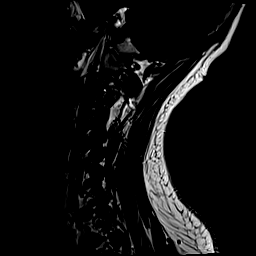
[im 16/16]
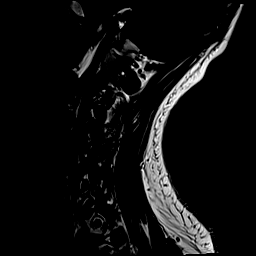

[32 of 32 positions shown; findings below may reference images not displayed]

FINDINGS: Alignment: No significant listhesis is present.

Vertebrae: Marrow signal and vertebral body heights are normal.

Cord: Normal signal and morphology.

Posterior Fossa, vertebral arteries, paraspinal tissues:
Craniocervical junction is normal. Flow is present in the vertebral
arteries bilaterally. Visualized intracranial contents are normal.

With

Disc levels:

No significant cervical disc disease or stenosis is present.
IMPRESSION: Negative MRI of the cervical spine.

## 2020-06-17 ENCOUNTER — Other Ambulatory Visit: Payer: BC Managed Care – PPO

## 2020-06-26 ENCOUNTER — Telehealth: Payer: Self-pay

## 2020-06-26 ENCOUNTER — Other Ambulatory Visit (HOSPITAL_COMMUNITY): Payer: Self-pay

## 2020-06-26 NOTE — Telephone Encounter (Signed)
Faxed a Prior Authorization request to [facility] for COSENTYX. Will update once we receive a response.  Fax# [phone] Phone# [phone]

## 2020-06-27 NOTE — Telephone Encounter (Signed)
Received notification from [facility] regarding a prior authorization for COSENTYX. Authorization has been APPROVED from 06/27/2020 to 06/27/2021.   Authorization # [id number] Phone # [phone]

## 2020-06-29 ENCOUNTER — Other Ambulatory Visit: Payer: Self-pay | Admitting: Rheumatology

## 2020-06-29 DIAGNOSIS — L409 Psoriasis, unspecified: Secondary | ICD-10-CM

## 2020-06-29 DIAGNOSIS — L4050 Arthropathic psoriasis, unspecified: Secondary | ICD-10-CM

## 2020-06-29 NOTE — Telephone Encounter (Signed)
Next Visit: 09/14/2020  Last Visit: 04/21/2020  Last Fill: 12/07/2019  DX: Psoriatic arthritis   Current Dose per office note 04/21/2020, Cosentyx 300 mg sq injection every 28 days   Labs: 04/21/2020, CBC and CMP WNL. Uric acid is WNL. Continue current dose of allopurinol. Vitamin D is WNL. Continue maintenance dose of vitamin D.   TB Gold: 11/19/2019, negative  Okay to refill Cosentyx?

## 2020-06-30 ENCOUNTER — Ambulatory Visit: Payer: BC Managed Care – PPO | Admitting: Diagnostic Neuroimaging

## 2020-06-30 ENCOUNTER — Telehealth: Payer: Self-pay | Admitting: Diagnostic Neuroimaging

## 2020-06-30 ENCOUNTER — Encounter: Payer: Self-pay | Admitting: Diagnostic Neuroimaging

## 2020-06-30 ENCOUNTER — Other Ambulatory Visit: Payer: Self-pay

## 2020-06-30 VITALS — BP 126/89 | HR 84 | Ht 71.0 in | Wt 245.2 lb

## 2020-06-30 DIAGNOSIS — M62838 Other muscle spasm: Secondary | ICD-10-CM | POA: Diagnosis not present

## 2020-06-30 DIAGNOSIS — R42 Dizziness and giddiness: Secondary | ICD-10-CM

## 2020-06-30 DIAGNOSIS — R200 Anesthesia of skin: Secondary | ICD-10-CM | POA: Diagnosis not present

## 2020-06-30 DIAGNOSIS — H539 Unspecified visual disturbance: Secondary | ICD-10-CM | POA: Diagnosis not present

## 2020-06-30 NOTE — Patient Instructions (Signed)
  DIZZINESS / VISION CHANGES / NUMBNESS - check MRI brain   MUSCLE SPASMS / MUSCLE PAIN - gradually return to physical activities

## 2020-06-30 NOTE — Progress Notes (Signed)
[facility]  PATIENT: Colin Jensen DOB: 30-Sep-1985  REFERRING CLINICIAN: Gearldine Bienenstock, PA-C HISTORY FROM: patient  REASON FOR VISIT: new consult    HISTORICAL  CHIEF COMPLAINT:  Chief Complaint  Patient presents with  . Neuralgia, muscle spasms    Rm 7, New Pt "issue since Dec 2021; had injection in L trapezius which helped x 2 days; played football many years; left sciatic nerve pain"     HISTORY OF PRESENT ILLNESS:   35 year old male here for evaluation of muscle spasms, numbness, headaches, vision changes, dizziness.  December 2021 patient had onset of left posterior scalp numbness sensation.  He was then diagnosed with a sinus infection.  He was treated with steroid injection and antibiotics.  He then developed some pain in his left arm.  Few weeks later he had some flulike symptoms and had some blisters on his left lateral scalp.  He was diagnosed with possible shingles and prescribed gabapentin and valacyclovir.  Symptoms eventually subsided.  Since that time he has had some other issues including left neck and trapezius soreness, pain, pain in his lower back and radiating to his left leg.  He has had some dull headaches, twice per week.  He has some sensitive to light and some vision changes.  Has had some subjective swaying and balance issues.  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  Previously played football and has been active for many years.  However he has not been able to exercise in the past 6 months due to the symptoms.  He has been reluctant to try because he does not want to flareup his symptoms.   REVIEW OF SYSTEMS: Full 14 system review of systems performed and negative with exception of: As per HPI.  ALLERGIES: Allergies  Allergen Reactions  . Nickel   . Ibuprofen Hives  . Penicillins Rash    HOME MEDICATIONS: Outpatient Medications Prior to Visit  Medication Sig Dispense Refill  . allopurinol (ZYLOPRIM) 300 MG  tablet Take 1 tablet (300 mg total) by mouth daily. 90 tablet 0  . Ascorbic Acid (VITAMIN C PO) Take by mouth daily.    Marland Kitchen b complex vitamins capsule Take 1 capsule by mouth daily.    . Cholecalciferol (VITAMIN D3 PO) Take 4,000 Units by mouth daily.    . colchicine 0.6 MG tablet TAKE 1 TABLET BY MOUTH EVERY DAY (Patient taking differently: as needed.) 90 tablet 0  . COSENTYX SENSOREADY, 300 MG, 150 MG/ML SOAJ INJECT TWO PENS SUBCUTANEOUSLY EVERY 4 WEEKS. REFRIGERATE. ALLOW 15 TO 30 MINUTES AT ROOM TEMP PRIOR TO ADMINISTRATION. 6 mL 0  . Cyanocobalamin (VITAMIN B-12 PO) Take by mouth daily.    . Multiple Vitamins-Minerals (ZINC PO) Take by mouth daily.     No facility-administered medications prior to visit.    PAST MEDICAL HISTORY: Past Medical History:  Diagnosis Date  . Gout   . Psoriatic arthritis (HCC)     PAST SURGICAL HISTORY: No past surgical history on file.  FAMILY HISTORY: Family History  Problem Relation Age of Onset  . Heart attack Father   . Psoriasis Father   . Diabetes Sister   . Healthy Son     SOCIAL HISTORY: Social History   Socioeconomic History  . Marital status: Married    Spouse name: Not on file  . Number of children: 1  . Years of education: Not on file  . Highest education level: Bachelor's degree (e.g., BA, AB, BS)  Occupational History  .  Not on file  Tobacco Use  . Smoking status: Never Smoker  . Smokeless tobacco: Former Neurosurgeon    Types: Snuff  Vaping Use  . Vaping Use: Never used  Substance and Sexual Activity  . Alcohol use: Not Currently  . Drug use: No  . Sexual activity: Not on file  Other Topics Concern  . Not on file  Social History Narrative   Lives with family   Social Determinants of Health   Financial Resource Strain: Not on file  Food Insecurity: Not on file  Transportation Needs: Not on file  Physical Activity: Not on file  Stress: Not on file  Social Connections: Not on file  Intimate Partner Violence: Not on  file     PHYSICAL EXAM  GENERAL EXAM/CONSTITUTIONAL: Vitals:  Vitals:   06/30/20 0812  BP: 126/89  Pulse: 84  Weight: 245 lb 3.2 oz (111.2 kg)  Height: 5\' 11"  (1.803 m)   Body mass index is 34.2 kg/m. Wt Readings from Last 3 Encounters:  06/30/20 245 lb 3.2 oz (111.2 kg)  04/21/20 235 lb (106.6 kg)  11/19/19 240 lb 9.6 oz (109.1 kg)    Patient is in no distress; well developed, nourished and groomed; neck is supple  CARDIOVASCULAR:  Examination of carotid arteries is normal; no carotid bruits  Regular rate and rhythm, no murmurs  Examination of peripheral vascular system by observation and palpation is normal  EYES:  Ophthalmoscopic exam of optic discs and posterior segments is normal; no papilledema or hemorrhages No exam data present  MUSCULOSKELETAL:  Gait, strength, tone, movements noted in Neurologic exam below  NEUROLOGIC: MENTAL STATUS:  No flowsheet data found.  awake, alert, oriented to person, place and time  recent and remote memory intact  normal attention and concentration  language fluent, comprehension intact, naming intact  fund of knowledge appropriate  CRANIAL NERVE:   2nd - no papilledema on fundoscopic exam  2nd, 3rd, 4th, 6th - pupils equal and reactive to light, visual fields full to confrontation, extraocular muscles intact, no nystagmus  5th - facial sensation symmetric  7th - facial strength symmetric  8th - hearing intact  9th - palate elevates symmetrically, uvula midline  11th - shoulder shrug symmetric  12th - tongue protrusion midline  MOTOR:   normal bulk and tone, full strength in the BUE, BLE  SENSORY:   normal and symmetric to light touch, temperature, vibration  COORDINATION:   finger-nose-finger, fine finger movements normal  REFLEXES:   deep tendon reflexes present and symmetric  GAIT/STATION:   narrow based gait     DIAGNOSTIC DATA (LABS, IMAGING, TESTING) - I reviewed patient  records, labs, notes, testing and imaging myself where available.  Lab Results  Component Value Date   WBC 5.9 04/21/2020   HGB 15.5 04/21/2020   HCT 45.2 04/21/2020   MCV 93.4 04/21/2020   PLT 191 04/21/2020      Component Value Date/Time   NA 140 04/21/2020 0829   K 3.9 04/21/2020 0829   CL 102 04/21/2020 0829   CO2 30 04/21/2020 0829   GLUCOSE 83 04/21/2020 0829   BUN 13 04/21/2020 0829   CREATININE 0.64 04/21/2020 0829   CALCIUM 9.5 04/21/2020 0829   PROT 7.1 04/21/2020 0829   ALBUMIN 4.2 05/07/2016 0850   AST 24 04/21/2020 0829   ALT 35 04/21/2020 0829   ALKPHOS 46 05/07/2016 0850   BILITOT 1.0 04/21/2020 0829   GFRNONAA 126 04/21/2020 0829   GFRAA 146 04/21/2020 0829  Lab Results  Component Value Date   CHOL 140 10/08/2016   HDL 40 (L) 10/08/2016   LDLCALC 82 10/08/2016   TRIG 91 10/08/2016   CHOLHDL 3.5 10/08/2016   No results found for: HGBA1C No results found for: VITAMINB12 No results found for: TSH   06/10/20 MRI cervical spine [I reviewed images myself and agree with interpretation. -[facility]P]  - normal    ASSESSMENT AND PLAN  35 y.o. year old male here with left scalp numbness and pain in December 2021, suspicious for occipital neuralgia.  Symptoms have resolved.  Also with musculoskeletal strain symptoms in the left trapezius, left arm, and low back pain issues with some radiation to left leg, could represent mild radiculopathy.  Neurologic examination is unremarkable.  Recommend conservative management for musculoskeletal symptoms.  Also was having some dizziness, vision changes and numbness sensations since that time, and will check MRI of the brain to rule out other CNS causes or demyelinating disease.  Dx:  1. Dizziness   2. Vision changes   3. Muscle spasm   4. Numbness      PLAN:  DIZZINESS / VISION CHANGES / NUMBNESS - check MRI brain (rule out demyelinating dz or other causes)  MUSCLE SPASMS / MUSCLE PAIN - gradually return to physical  activities  Orders Placed This Encounter  Procedures  . MR BRAIN W WO CONTRAST   Return for pending test results.    Suanne Marker, MD 06/30/2020, 9:20 AM Certified in Neurology, Neurophysiology and Neuroimaging  [facility] [address], [address] [place], [place] [address] [phone]

## 2020-06-30 NOTE — Telephone Encounter (Addendum)
LVM requesting cb to schedule JM 5/31  MR brain w/wo contrast bcbs auth: [id number] (06/30/20-07/29/20)  Pt to schedule at [facility]

## 2020-07-26 NOTE — Telephone Encounter (Signed)
Returned pt's call and informed him of financial responsibility for MRI. He states he will call us back after discussing cost with his wife.

## 2020-07-28 ENCOUNTER — Telehealth: Payer: Self-pay | Admitting: Rheumatology

## 2020-07-28 NOTE — Telephone Encounter (Signed)
Left message to advise patient No known interactions between duloxetine and allopurinol, colchicine, or cosentyx. Recommend continuing close lab monitoring every 3 months

## 2020-07-28 NOTE — Telephone Encounter (Signed)
Patient calling because PCP has put him on new medication, Duloxetine. Patient wants to make sure it is okay for him to take. Please call to advise.

## 2020-07-28 NOTE — Telephone Encounter (Signed)
No known interactions between duloxetine and allopurinol, colchicine, or cosentyx. Recommend continuing close lab monitoring every 3 months.

## 2020-08-03 ENCOUNTER — Other Ambulatory Visit: Payer: Self-pay | Admitting: Physician Assistant

## 2020-08-03 DIAGNOSIS — M1A0710 Idiopathic chronic gout, right ankle and foot, without tophus (tophi): Secondary | ICD-10-CM

## 2020-08-03 NOTE — Telephone Encounter (Signed)
Next Visit: 09/14/2020   Last Visit: 04/21/2020   Last Fill: 04/21/2020  Current Dose per office note on 04/21/2020:  allopurinol 300 mg 1 tablet by mouth daily.  DX: Idiopathic chronic gout of right ankle without tophus  Labs: 04/21/2020, CBC and CMP WNL.  Uric acid is WNL.  Continue current dose of allopurinol. Vitamin D is WNL. Continue maintenance dose of vitamin D.   Per protocol okay to refill per Dr. Corliss Skains

## 2020-09-01 NOTE — Progress Notes (Signed)
Office Visit Note  Patient: Colin Jensen             Date of Birth: [birth year]           MRN: [id number]             PCP: Juliette Alcide, MD Referring: Juliette Alcide, MD Visit Date: 09/14/2020 Occupation: @GUAROCC @  Subjective:  Neck and left shoulder pain.   History of Present Illness: Colin Jensen is a 35 y.o. male history of psoriatic arthritis, psoriasis and osteoarthritis.  He states recently has been experiencing pain and discomfort in his neck and left shoulder.  He had left trapezius injection by his PCP which helped temporarily.  He was also placed on Cymbalta due to discomfort.  He states the neck discomfort improved after Cymbalta but the shoulder joint pain persist.  He recently had a x-ray of his shoulder joint which was unremarkable.  MRI of the shoulder joint is pending.  None of the other joints are painful or swollen.  He denies any episodes of plantar fasciitis, Achilles tendinitis or iritis.  He had recent evaluation by ophthalmologist.  He denies any gout flare.  Activities of Daily Living:  Patient reports morning stiffness for 10 minutes.   Patient Denies nocturnal pain.  Difficulty dressing/grooming: Denies Difficulty climbing stairs: Denies Difficulty getting out of chair: Denies Difficulty using hands for taps, buttons, cutlery, and/or writing: Denies  Review of Systems  Constitutional:  Negative for fatigue.  HENT:  Negative for mouth dryness.   Eyes:  Negative for dryness.  Respiratory:  Negative for shortness of breath.   Cardiovascular:  Negative for swelling in legs/feet.  Gastrointestinal:  Negative for constipation.  Endocrine: Negative for excessive thirst.  Genitourinary:  Negative for difficulty urinating.  Musculoskeletal:  Positive for muscle weakness and morning stiffness.  Skin:  Negative for rash.  Allergic/Immunologic: Negative for susceptible to infections.  Neurological:  Positive for numbness.  Hematological:  Negative for  bruising/bleeding tendency.  Psychiatric/Behavioral:  Negative for sleep disturbance.    PMFS History:  Patient Active Problem List   Diagnosis Date Noted   History of obesity 05/01/2016   Psoriatic arthritis (HCC) 12/06/2015   Psoriasis 12/06/2015   High risk medication use 12/06/2015    Past Medical History:  Diagnosis Date   Gout    Psoriatic arthritis (HCC)     Family History  Problem Relation Age of Onset   Heart attack Father    Psoriasis Father    Diabetes Sister    Healthy Son    History reviewed. No pertinent surgical history. Social History   Social History Narrative   Lives with family   Immunization History  Administered Date(s) Administered   Influenza Inj Mdck Quad With Preservative 01/23/2018   PFIZER(Purple Top)SARS-COV-2 Vaccination 06/23/2019, 07/14/2019   Tdap 01/23/2018     Objective: Vital Signs: BP 109/63 (BP Location: Left Arm, Patient Position: Sitting, Cuff Size: Normal)   Pulse 60   Resp 14   Ht 5\' 11"  (1.803 m)   Wt 243 lb (110.2 kg)   BMI 33.89 kg/m    Physical Exam Vitals and nursing note reviewed.  Constitutional:      Appearance: He is well-developed.  HENT:     Head: Normocephalic and atraumatic.  Eyes:     Conjunctiva/sclera: Conjunctivae normal.     Pupils: Pupils are equal, round, and reactive to light.  Cardiovascular:     Rate and Rhythm: Normal rate and regular rhythm.  Heart sounds: Normal heart sounds.  Pulmonary:     Effort: Pulmonary effort is normal.     Breath sounds: Normal breath sounds.  Abdominal:     General: Bowel sounds are normal.     Palpations: Abdomen is soft.  Musculoskeletal:     Cervical back: Normal range of motion and neck supple.  Skin:    General: Skin is warm and dry.     Capillary Refill: Capillary refill takes less than 2 seconds.  Neurological:     Mental Status: He is alert and oriented to person, place, and time.  Psychiatric:        Behavior: Behavior normal.      Musculoskeletal Exam: C-spine was in good range of motion.  He had some trapezius spasm.  He had painful range of motion of the left shoulder joint with abduction and internal rotation.  Elbow joints, wrist joints, MCPs PIPs and DIPs with good range of motion with no synovitis.  Hip joints, knee joints, ankles, MTPs and PIPs with good range of motion with no synovitis.  He had no Achilles tendinitis or plantar fasciitis.  CDAI Exam: CDAI Score: -- Patient Global: --; Provider Global: -- Swollen: --; Tender: -- Joint Exam 09/14/2020   No joint exam has been documented for this visit   There is currently no information documented on the homunculus. Go to the Rheumatology activity and complete the homunculus joint exam.  Investigation: No additional findings.  Imaging: No results found.  Recent Labs: Lab Results  Component Value Date   WBC 5.9 04/21/2020   HGB 15.5 04/21/2020   PLT 191 04/21/2020   NA 140 04/21/2020   K 3.9 04/21/2020   CL 102 04/21/2020   CO2 30 04/21/2020   GLUCOSE 83 04/21/2020   BUN 13 04/21/2020   CREATININE 0.64 04/21/2020   BILITOT 1.0 04/21/2020   ALKPHOS 46 05/07/2016   AST 24 04/21/2020   ALT 35 04/21/2020   PROT 7.1 04/21/2020   ALBUMIN 4.2 05/07/2016   CALCIUM 9.5 04/21/2020   GFRAA 146 04/21/2020   QFTBGOLDPLUS NEGATIVE 11/19/2019    Speciality Comments: No specialty comments available.  Procedures:  No procedures performed Allergies: Nickel, Ibuprofen, and Penicillins   Assessment / Plan:     Visit Diagnoses: Psoriatic arthritis (HCC)-patient had no synovitis on my examination today.  He has been tolerating Cosentyx well.  Psoriasis-he had no active psoriasis lesions.  High risk medication use - Cosentyx 300 mg sq injection every 28 days (started on 08/06/18). - Plan: CBC with Differential/Platelet, COMPLETE METABOLIC PANEL WITH GFR today and then every 3 months to monitor for drug toxicity.  TB Gold will be done with next labs.  He  has been advised to stop Cosentyx in case he develops an infection and restart after infection resolves.  I discussed and placed recommendations regarding immunization in the AVS.  Left shoulder pain-he has been having pain and discomfort in his left shoulder joint.  He states he was evaluated by his PCP who did x-rays which was unremarkable.  He is a scheduled to have MRI of the left shoulder.  Idiopathic chronic gout of right ankle without tophus - allopurinol 300 mg 1 tablet by mouth daily. uric acid: 04/21/2020 4.6 -he denies having any gout flare.  He has been taking allopurinol on a regular basis.  Plan: Uric acid  Vitamin D deficiency-he has been taking vitamin D on a regular basis.  Trapezius muscle spasm-improved after the injection.  Neuralgia-he has noticed  improvement and neurology is on Cymbalta.  Family history of diabetes mellitus - Plan: Hemoglobin A1c  Screening for diabetes mellitus -he is concerned about diabetes as there is a strong family history of diabetes in his family.  Plan: Hemoglobin A1c.  Increased risk of heart disease with psoriatic arthritis was also discussed.  Dietary modifications and exercise was emphasized.  Orders: Orders Placed This Encounter  Procedures   CBC with Differential/Platelet   COMPLETE METABOLIC PANEL WITH GFR   Hemoglobin A1c   Uric acid   No orders of the defined types were placed in this encounter.    Follow-Up Instructions: Return in about 5 months (around 02/14/2021) for Psoriatic arthritis.   Pollyann Savoy, MD  Note - This record has been created using Animal nutritionist.  Chart creation errors have been sought, but may not always  have been located. Such creation errors do not reflect on  the standard of medical care.

## 2020-09-12 ENCOUNTER — Other Ambulatory Visit: Payer: Self-pay | Admitting: Family Medicine

## 2020-09-12 DIAGNOSIS — S43432A Superior glenoid labrum lesion of left shoulder, initial encounter: Secondary | ICD-10-CM

## 2020-09-14 ENCOUNTER — Ambulatory Visit: Payer: BC Managed Care – PPO | Admitting: Rheumatology

## 2020-09-14 ENCOUNTER — Encounter: Payer: Self-pay | Admitting: Rheumatology

## 2020-09-14 ENCOUNTER — Other Ambulatory Visit: Payer: Self-pay

## 2020-09-14 VITALS — BP 109/63 | HR 60 | Resp 14 | Ht 71.0 in | Wt 243.0 lb

## 2020-09-14 DIAGNOSIS — M62838 Other muscle spasm: Secondary | ICD-10-CM

## 2020-09-14 DIAGNOSIS — M1A0710 Idiopathic chronic gout, right ankle and foot, without tophus (tophi): Secondary | ICD-10-CM

## 2020-09-14 DIAGNOSIS — Z79899 Other long term (current) drug therapy: Secondary | ICD-10-CM

## 2020-09-14 DIAGNOSIS — L4050 Arthropathic psoriasis, unspecified: Secondary | ICD-10-CM | POA: Diagnosis not present

## 2020-09-14 DIAGNOSIS — E559 Vitamin D deficiency, unspecified: Secondary | ICD-10-CM

## 2020-09-14 DIAGNOSIS — M25512 Pain in left shoulder: Secondary | ICD-10-CM | POA: Diagnosis not present

## 2020-09-14 DIAGNOSIS — L409 Psoriasis, unspecified: Secondary | ICD-10-CM | POA: Diagnosis not present

## 2020-09-14 DIAGNOSIS — Z833 Family history of diabetes mellitus: Secondary | ICD-10-CM

## 2020-09-14 DIAGNOSIS — M792 Neuralgia and neuritis, unspecified: Secondary | ICD-10-CM

## 2020-09-14 DIAGNOSIS — Z131 Encounter for screening for diabetes mellitus: Secondary | ICD-10-CM

## 2020-09-14 DIAGNOSIS — G8929 Other chronic pain: Secondary | ICD-10-CM

## 2020-09-14 NOTE — Patient Instructions (Signed)
Standing Labs We placed an order today for your standing lab work.   Please have your standing labs drawn in November and every 3 months Please get TB Gold in November  If possible, please have your labs drawn 2 weeks prior to your appointment so that the provider can discuss your results at your appointment.  Please note that you may see your imaging and lab results in MyChart before we have reviewed them. We may be awaiting multiple results to interpret others before contacting you. Please allow our office up to 72 hours to thoroughly review all of the results before contacting the office for clarification of your results.  We have open lab daily: Monday through Thursday from 1:30-4:30 PM and Friday from 1:30-4:00 PM at the office of Dr. Pollyann Savoy, [facility].   Please be advised, all patients with office appointments requiring lab work will take precedent over walk-in lab work.  If possible, please come for your lab work on Monday and Friday afternoons, as you may experience shorter wait times. The office is located at [address], [address], [place], [place] [address] No appointment is necessary.   Labs are drawn by [facility]. Please bring your co-pay at the time of your lab draw.  You may receive a bill from [facility] for your lab work.  If you wish to have your labs drawn at another location, please call the office 24 hours in advance to send orders.  If you have any questions regarding directions or hours of operation,  please call [phone].   As a reminder, please drink plenty of water prior to coming for your lab work. Thanks!   Vaccines You are taking a medication(s) that can suppress your immune system.  The following immunizations are recommended: Flu annually Covid-19  Td/Tdap (tetanus, diphtheria, pertussis) every 10 years Pneumonia (Prevnar 15 then Pneumovax 23 at least 1 year apart.  Alternatively, can take Prevnar 20 without needing additional  dose) Shingrix (after age 25): 2 doses from 4 weeks to 6 months apart  Please check with your PCP to make sure you are up to date.   If you test POSITIVE for COVID19 and have MILD to MODERATE symptoms: First, call your PCP if you would like to receive COVID19 treatment AND Hold your medications during the infection and for at least 1 week after your symptoms have resolved: Injectable medication (Benlysta, Cimzia, Cosentyx, Enbrel, Humira, Orencia, Remicade, Simponi, Stelara, Taltz, Tremfya) Methotrexate Leflunomide (Arava) Azathioprine Mycophenolate (Cellcept) Osborne [place], or Rinvoq Otezla If you take Actemra or Kevzara, you DO NOT need to hold these for COVID19 infection.  If you test POSITIVE for COVID19 and have NO symptoms: First, call your PCP if you would like to receive COVID19 treatment AND Hold your medications for at least 10 days after the day that you tested positive Injectable medication (Benlysta, Cimzia, Cosentyx, Enbrel, Humira, Orencia, Remicade, Simponi, Stelara, Taltz, Tremfya) Methotrexate Leflunomide (Arava) Azathioprine Mycophenolate (Cellcept) Osborne [place], or Rinvoq Otezla If you take Actemra or Kevzara, you DO NOT need to hold these for COVID19 infection.  If you have signs or symptoms of an infection or start antibiotics: First, call your PCP for workup of your infection. Hold your medication through the infection, until you complete your antibiotics, and until symptoms resolve if you take the following: Injectable medication (Actemra, Benlysta, Cimzia, Cosentyx, Enbrel, Humira, Kevzara, Orencia, Remicade, Simponi, Stelara, Taltz, Tremfya) Methotrexate Leflunomide (Arava) Mycophenolate (Cellcept) Osborne [place], or Rinvoq  Heart Disease Prevention   Your  inflammatory disease increases your risk of heart disease which includes heart attack, stroke, atrial fibrillation (irregular heartbeats), high blood pressure, heart failure and  atherosclerosis (plaque in the arteries).  It is important to reduce your risk by:   Keep blood pressure, cholesterol, and blood sugar at healthy levels   Smoking Cessation   Maintain a healthy weight  BMI 20-25   Eat a healthy diet  Plenty of fresh fruit, vegetables, and whole grains  Limit saturated fats, foods high in sodium, and added sugars  DASH and Mediterranean diet   Increase physical activity  Recommend moderate physically activity for 150 minutes per week/ 30 minutes a day for five days a week These can be broken up into three separate ten-minute sessions during the day.   Reduce Stress  Meditation, slow breathing exercises, yoga, coloring books  Dental visits twice a year

## 2020-09-15 LAB — COMPLETE METABOLIC PANEL WITH GFR
AG Ratio: 1.8 (calc) (ref 1.0–2.5)
ALT: 56 U/L — ABNORMAL HIGH (ref 9–46)
AST: 38 U/L (ref 10–40)
Albumin: 4.7 g/dL (ref 3.6–5.1)
Alkaline phosphatase (APISO): 56 U/L (ref 36–130)
BUN: 13 mg/dL (ref 7–25)
CO2: 33 mmol/L — ABNORMAL HIGH (ref 20–32)
Calcium: 9.6 mg/dL (ref 8.6–10.3)
Chloride: 101 mmol/L (ref 98–110)
Creat: 0.78 mg/dL (ref 0.60–1.26)
Globulin: 2.6 g/dL (calc) (ref 1.9–3.7)
Glucose, Bld: 76 mg/dL (ref 65–99)
Potassium: 4.4 mmol/L (ref 3.5–5.3)
Sodium: 140 mmol/L (ref 135–146)
Total Bilirubin: 1.2 mg/dL (ref 0.2–1.2)
Total Protein: 7.3 g/dL (ref 6.1–8.1)
eGFR: 118 mL/min/{1.73_m2} (ref >=60)

## 2020-09-15 LAB — CBC WITH DIFFERENTIAL/PLATELET
Absolute Monocytes: 432 cells/uL (ref 200–950)
Basophils Absolute: 61 cells/uL (ref 0–200)
Basophils Relative: 1.3 %
Eosinophils Absolute: 113 cells/uL (ref 15–500)
Eosinophils Relative: 2.4 %
HCT: 45.5 % (ref 38.5–50.0)
Hemoglobin: 15.3 g/dL (ref 13.2–17.1)
Lymphs Abs: 1499 cells/uL (ref 850–3900)
MCH: 31.2 pg (ref 27.0–33.0)
MCHC: 33.6 g/dL (ref 32.0–36.0)
MCV: 92.9 fL (ref 80.0–100.0)
MPV: 10.8 fL (ref 7.5–12.5)
Monocytes Relative: 9.2 %
Neutro Abs: 2594 cells/uL (ref 1500–7800)
Neutrophils Relative %: 55.2 %
Platelets: 195 10*3/uL (ref 140–400)
RBC: 4.9 10*6/uL (ref 4.20–5.80)
RDW: 12.7 % (ref 11.0–15.0)
Total Lymphocyte: 31.9 %
WBC: 4.7 10*3/uL (ref 3.8–10.8)

## 2020-09-15 LAB — HEMOGLOBIN A1C
Hgb A1c MFr Bld: 4.6 % of total Hgb (ref <5.7)
Mean Plasma Glucose: 85 mg/dL
eAG (mmol/L): 4.7 mmol/L

## 2020-09-15 LAB — URIC ACID: Uric Acid, Serum: 5.9 mg/dL (ref 4.0–8.0)

## 2020-09-15 NOTE — Progress Notes (Signed)
CBC is normal.  Liver functions are elevated.  Please advise patient avoid all anti-inflammatories, alcohol intake and work on weight loss.  Hemoglobin A1c is 4.6 which is normal.  Uric acid is 5.9 which is in desirable range.

## 2020-10-05 ENCOUNTER — Ambulatory Visit
Admission: RE | Admit: 2020-10-05 | Discharge: 2020-10-05 | Disposition: A | Discharge status: Home / Self Care | Payer: BC Managed Care – PPO | Source: Ambulatory Visit | Attending: Family Medicine | Admitting: Family Medicine

## 2020-10-05 ENCOUNTER — Other Ambulatory Visit: Payer: Self-pay

## 2020-10-05 DIAGNOSIS — S43432A Superior glenoid labrum lesion of left shoulder, initial encounter: Secondary | ICD-10-CM

## 2020-10-05 IMAGING — XA DG FLUORO GUIDE NDL PLC/BX
2 series · 2 of 2 positions shown · non-contrast
Comparison: none

CLINICAL DATA: 35-year-old male with chronic left shoulder pain.

[Series 1: ortho adipose · 1 of 1 slices shown (1 of 2)]
[im 1/1]
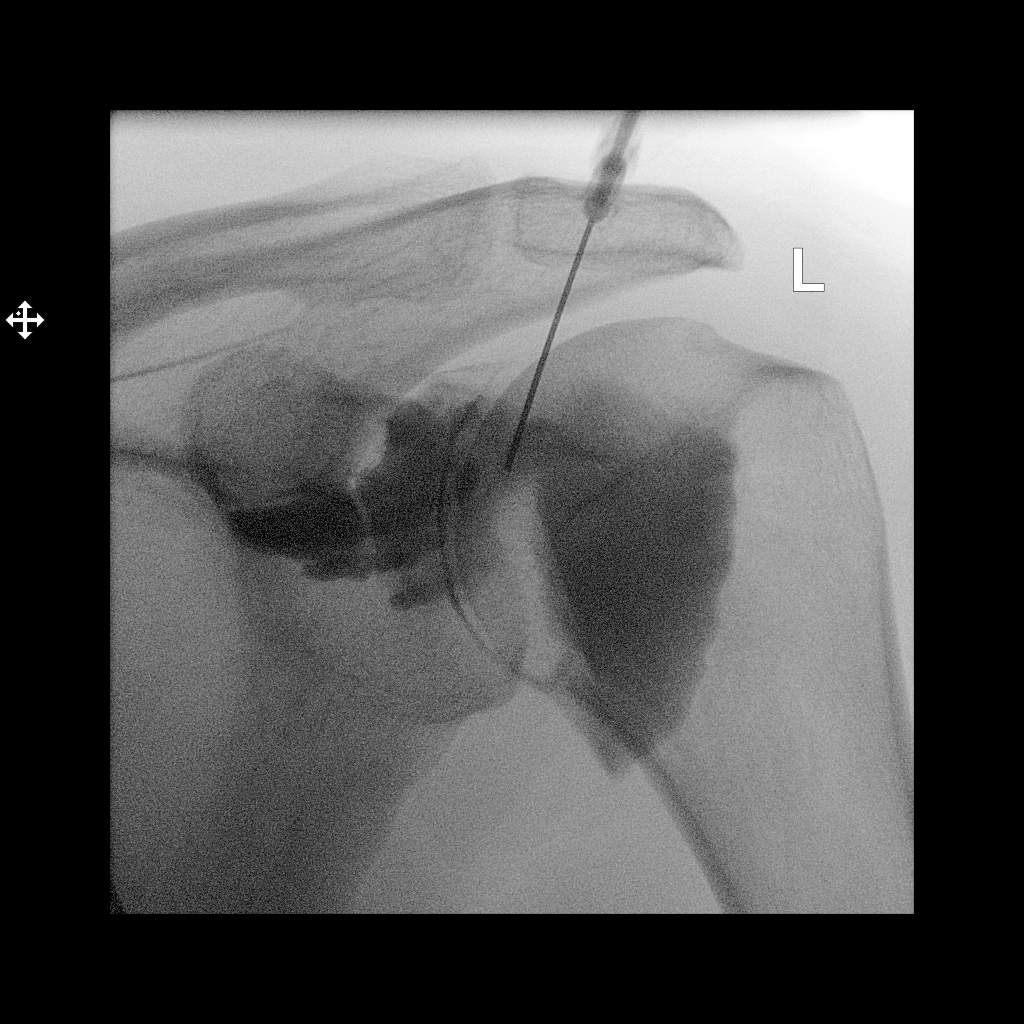

[Series 2: ortho adipose · 1 of 1 slices shown (2 of 2)]
[im 1/1]
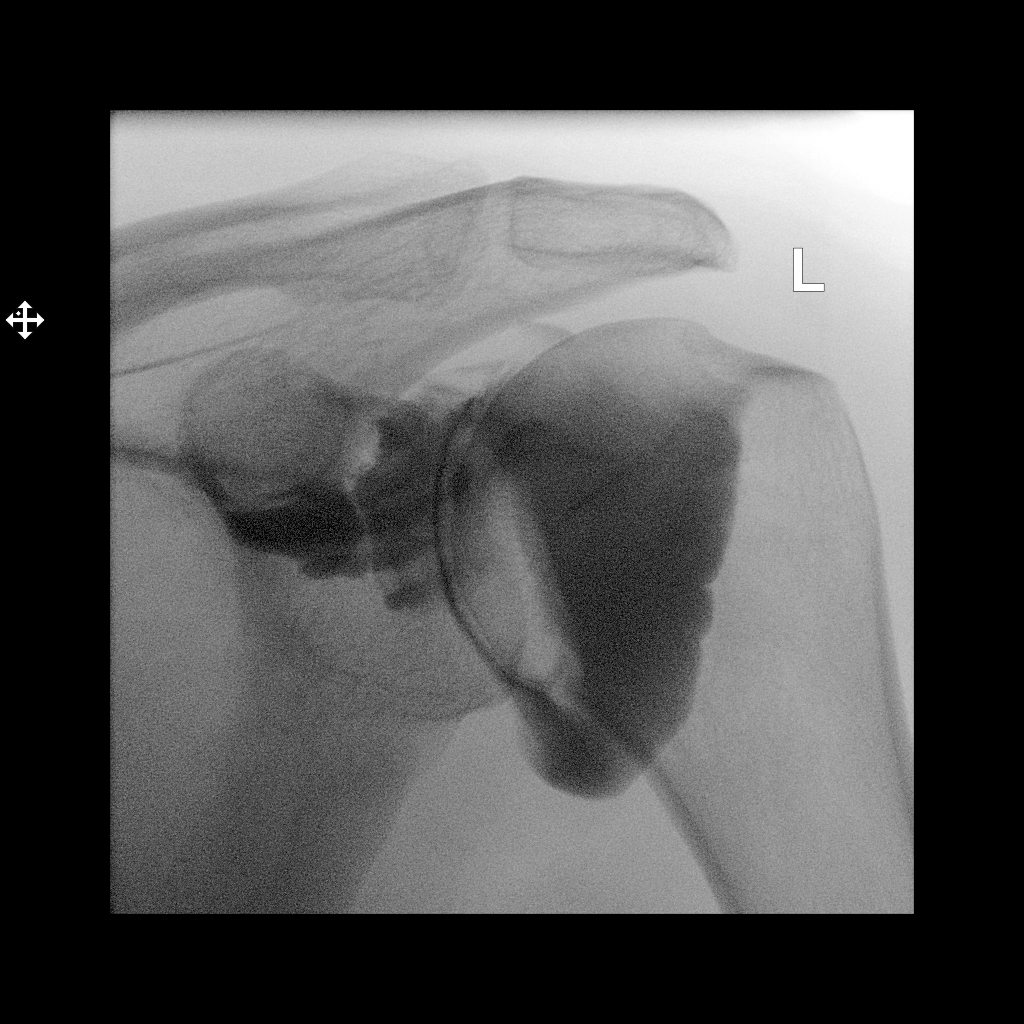

[2 of 2 positions shown; findings below may reference images not displayed]

EXAM:
SHOULDER INJECTION FOR MRI

FLUOROSCOPY TIME:  0 minutes 29 seconds 1.8 mGy

PROCEDURE:
After a thorough discussion of risks and benefits of the procedure,
written and oral informed consent was obtained. The consent
discussion included the risk of bleeding, infection and injury to
nerves and adjacent blood vessels. Extra-articular injection was
also a possible risk discussed.

Preliminary localization was performed over the left shoulder. The
area was marked over superior medial anterior humeral head.

After prep and drape in the usual sterile fashion, the skin and
deeper subcutaneous tissues were anesthetized with 1% Lidocaine
without Epinephrine. Under fluoroscopic guidance, a 22 gauge
inch spinal needle was advanced into the joint over the superior
medial anterior humeral head using an anterior approach.
Intra-articular injection of Lidocaine was performed which flowed
freely and subsequently the joint was distended with 12 ml of a
[DATE] dilution of Multihance contrast. The MR arthrogram solution
was as follows: 15 ml of omnipaque 180 contrast agent, 0.1 mL
Multihance, 5 ml of 1% Lidocaine. An end point was felt as well as
the patient experiencing pressure and the injection was
discontinued, the needle removed, and a sterile dressing applied.
The patient was taken to MRI for subsequent imaging.

The patient tolerated the procedure well and there were no
complications.
IMPRESSION: Successful left shoulder fluoroscopically guided injection.

## 2020-10-05 IMAGING — MR MR SHOULDER*L* W/ CM
4 of 7 series · 15 of 40 positions shown · IV contrast (agent unspecified)
Comparison: None.

CLINICAL DATA: Left shoulder pain

EXAM:
MR ARTHROGRAM OF THE left SHOULDER
TECHNIQUE: Multiplanar, multisequence MR imaging of the left shoulder was
performed following the administration of intra-articular contrast.
CONTRAST:  See Injection Documentation.

[Series 7: T1 fat-sat · axial · left · 3.0mm · 0.36mm/px · z∈[-68,+25]mm · 3 of 28 slices shown]
[im 1/28]
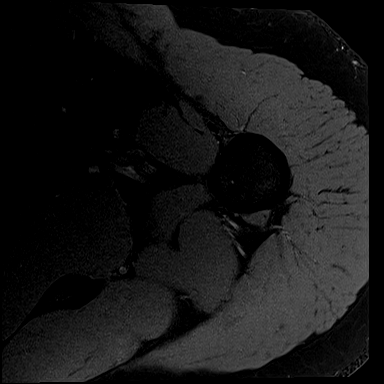
[im 14/28]
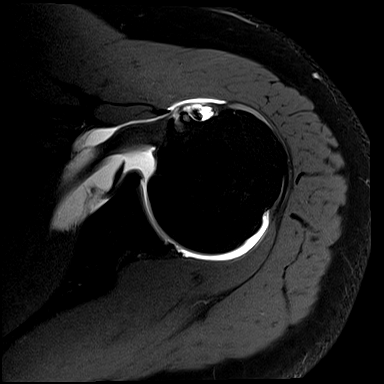
[im 28/28]
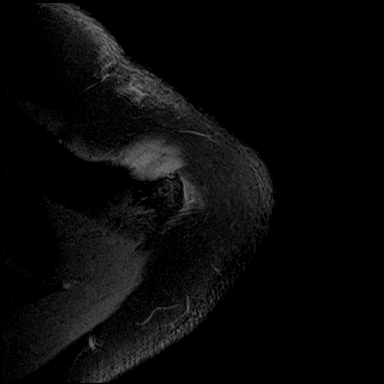

[Series 8: T2 fat-sat · oblique · left · 3.0mm · 0.22mm/px · 6 of 29 slices shown (1 of 3)]
[im 1/29]
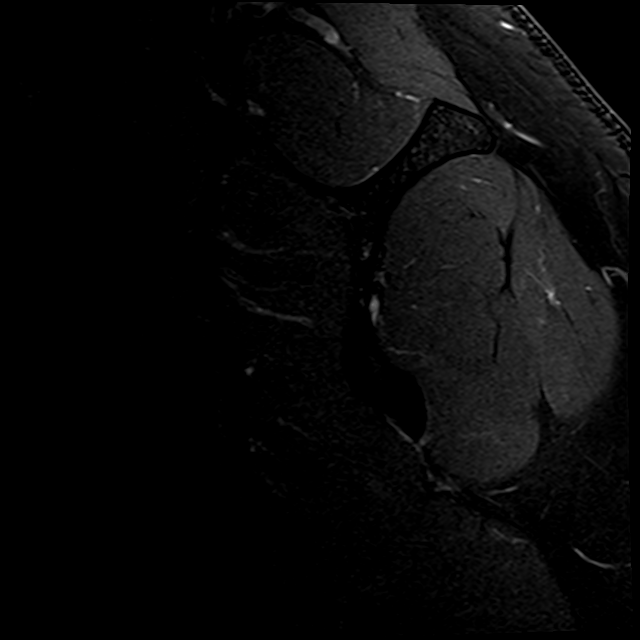
[im 6/29]
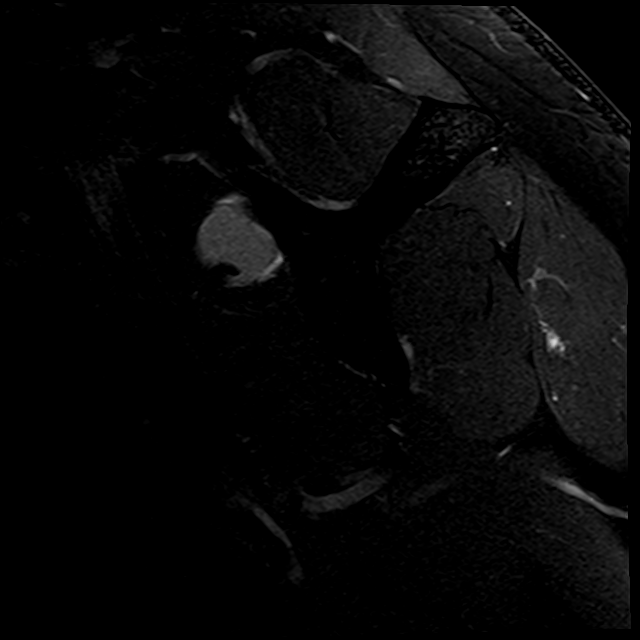
[im 12/29]
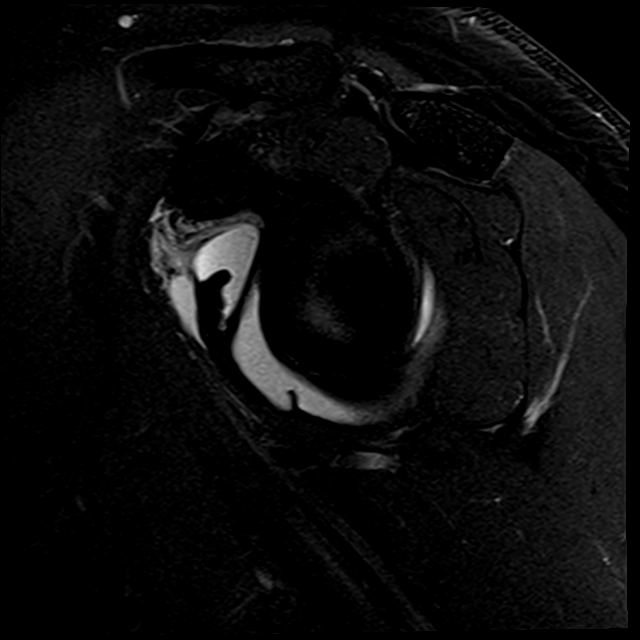
[im 17/29]
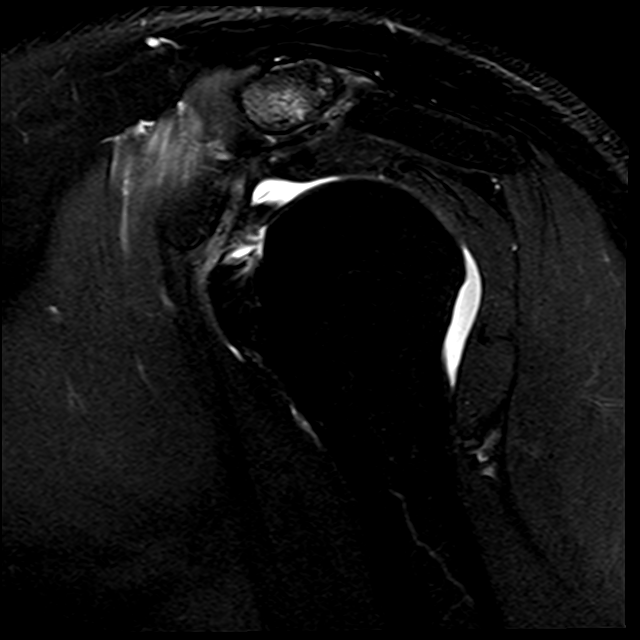
[im 23/29]
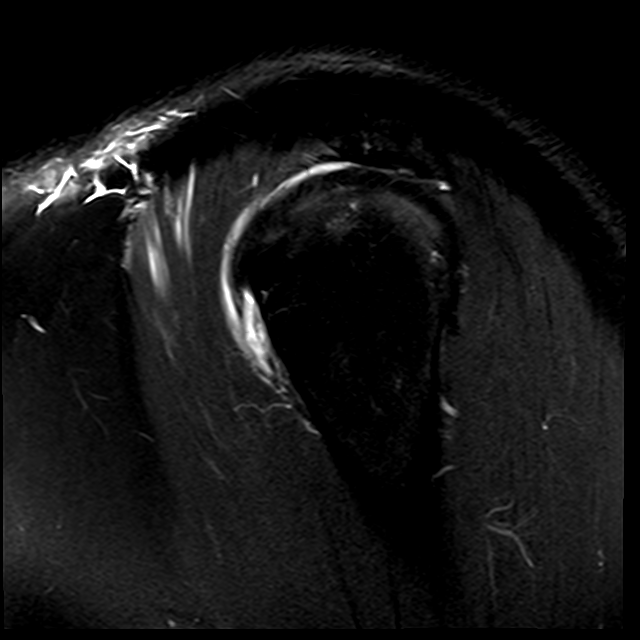
[im 29/29]
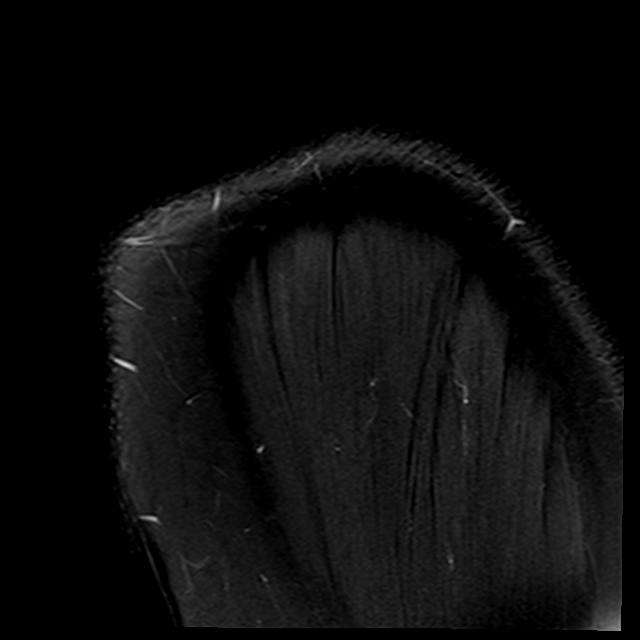

[Series 11: T2 fat-sat · oblique · left · 3.0mm · 0.22mm/px · 3 of 28 slices shown (2 of 3)]
[im 6/28]
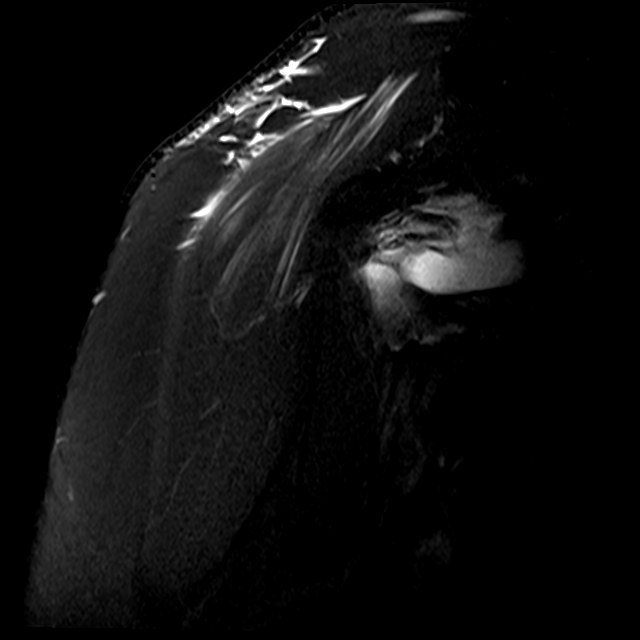
[im 17/28]
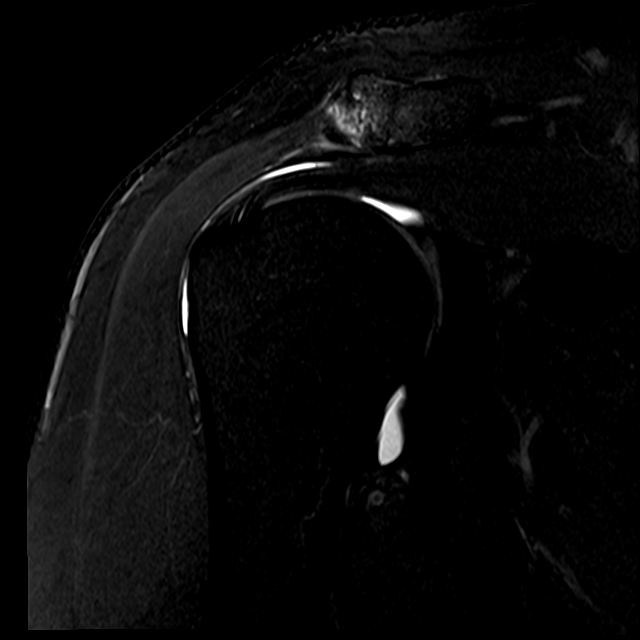
[im 28/28]
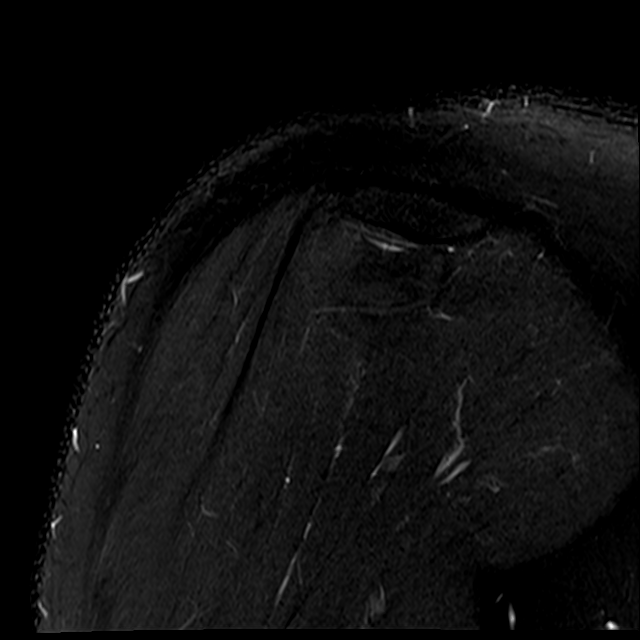

[Series 102: T2 fat-sat · oblique · left · 3.0mm · 0.22mm/px · 3 of 28 slices shown (3 of 3)]
[im 6/28]
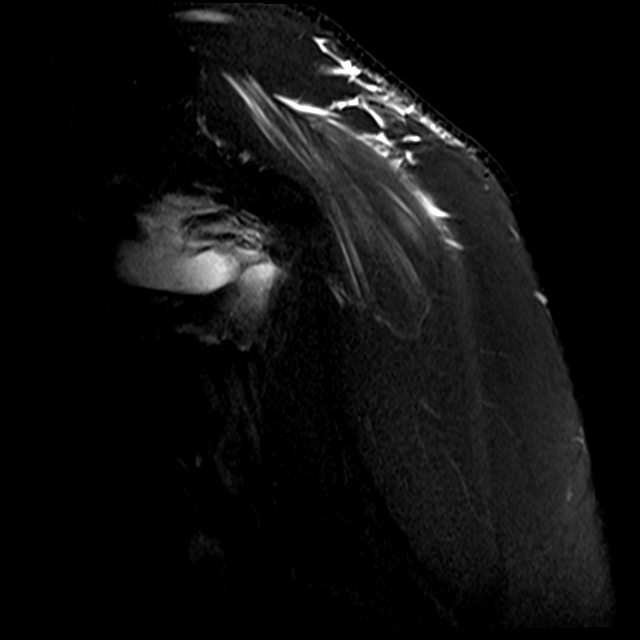
[im 17/28]
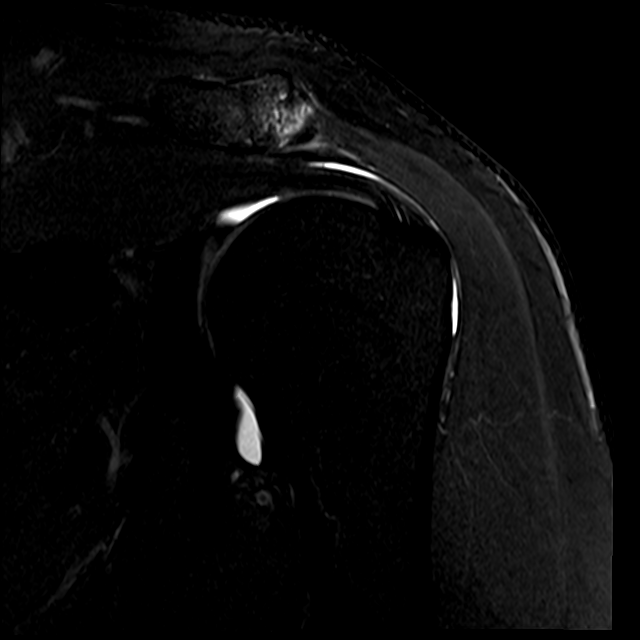
[im 28/28]
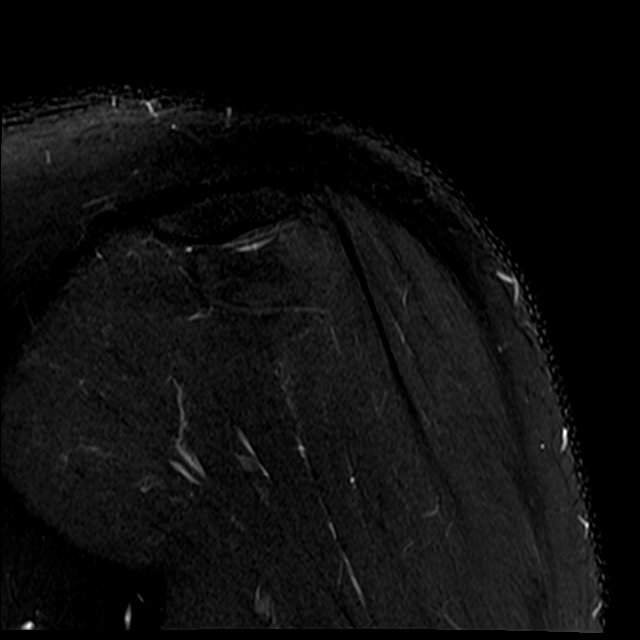

[15 of 40 positions shown; findings below may reference images not displayed]

FINDINGS: Rotator cuff: Supraspinatus tendon is intact. Infraspinatus tendon
is intact. Teres minor tendon is intact. Subscapularis tendon is
intact.

Muscles: No muscle atrophy. No intramuscular fluid collection or
hematoma.

Biceps Long Head: The intra and extra articular long head biceps
tendon is intact. There is tendinosis and fraying of the tendon as
it enters the bicipital groove (T1 axial image 1).

Acromioclavicular Joint: There is bony edema in the distal clavicle
at the AC joint, with subchondral cystic change. Small amount of
subacromial/subdeltoid bursal fluid likely related to injection.

Glenohumeral Joint: Adequatedistension of the glenohumeral joint. No
chondral defect.

Labrum: There is mild irregularity of the superior labrum at the
biceps labral anchor and just anteriorly, from approximately [DATE]
to [DATE].

Bones: No fracture or dislocation. No aggressive osseous lesion.

Other: No fluid collection or hematoma.
IMPRESSION: Bony edema and cystic change in the distal clavicle, consistent with
mild distal clavicle osteolysis, which could be posttraumatic or
with stress-induced overuse, commonly in weight lifters.

Tendinosis and fraying of the long head biceps tendon as it enters
the bicipital groove. The tendon is otherwise intact.

Mild irregularity of the superior labrum at the biceps labral anchor
and just anteriorly, from proximally [DATE] to [DATE], possibly mild
fraying and nondisplaced tearing.

No evidence of rotator cuff tear.  No muscle atrophy.

## 2020-10-05 MED ORDER — IOPAMIDOL (ISOVUE-M 200) INJECTION 41%
15.0000 mL | Freq: Once | INTRAMUSCULAR | Status: AC
  Administered 2020-10-05: 15 mL via INTRATHECAL

## 2020-11-10 ENCOUNTER — Telehealth: Payer: Self-pay | Admitting: Rheumatology

## 2020-11-10 NOTE — Telephone Encounter (Signed)
Patient calling to find out if he can start Paxlovid now since he just tested positive for COVID, or hold medication Cosentyx/ Colchicine / allopurinol ? Please call to advise.

## 2020-11-10 NOTE — Telephone Encounter (Signed)
I spoke with Sherron Ales, PA-C and advised her of the situation. Per Ladona Ridgel, patient should hold colchicine until 2 weeks after symptoms resolve and  hold colchicine while taking paxlovid. I advised the patient to ask the pharmacist about allopurinol. Also advised the patient that the pharmacist should do a medication list screening with him when picking up the paxlovid. Patient verbalized understanding.

## 2020-11-23 ENCOUNTER — Other Ambulatory Visit: Payer: Self-pay | Admitting: Rheumatology

## 2020-11-23 MED ORDER — PREDNISONE 5 MG PO TABS: ORAL_TABLET | ORAL | 0 refills | Status: DC

## 2020-11-23 NOTE — Telephone Encounter (Signed)
Patient had to stop medication due to a positive COVID test. Patient's last injection was five weeks ago. Patient wanted to know if a Prednisone dose pack could be called in for him due to stiffness in joints, knee and ankles. Pt uses [facility] in [place]. Please call to advise.

## 2020-11-23 NOTE — Telephone Encounter (Signed)
Please call in prednisone taper starting at 20 mg and taper by 5 mg every 4 days.

## 2020-11-23 NOTE — Telephone Encounter (Signed)
Patient returned call. Patient states he is still having some congestion cough and fatigue. Patient had last Cosentyx about 5 weeks ago.

## 2020-11-23 NOTE — Telephone Encounter (Signed)
Attempted to contact the patient and left message for patient to call the office.  

## 2020-11-23 NOTE — Telephone Encounter (Signed)
Patient may resume Cosentyx 2 weeks after the cough and congestion resolves.

## 2020-11-24 NOTE — Telephone Encounter (Signed)
Patient advised he may resume Cosentyx 2 weeks after the cough and congestion resolves. Patient expressed understanding.

## 2021-01-09 ENCOUNTER — Other Ambulatory Visit: Payer: Self-pay | Admitting: Physician Assistant

## 2021-01-09 DIAGNOSIS — L409 Psoriasis, unspecified: Secondary | ICD-10-CM

## 2021-01-09 DIAGNOSIS — L4050 Arthropathic psoriasis, unspecified: Secondary | ICD-10-CM

## 2021-01-09 DIAGNOSIS — Z111 Encounter for screening for respiratory tuberculosis: Secondary | ICD-10-CM

## 2021-01-09 DIAGNOSIS — Z9225 Personal history of immunosupression therapy: Secondary | ICD-10-CM

## 2021-01-09 NOTE — Telephone Encounter (Signed)
Next Visit: 02/14/2021  Last Visit: 09/14/2020  Last Fill: 06/29/2020  GF:[id number] arthritis   Current Dose per office note 09/14/2020: Cosentyx 300 mg sq injection every 28 days   Labs: 8/11/202 CBC is normal.  Liver functions are elevated.    TB Gold: 11/19/2019 Neg    Patient advised he is due to update labs. Patient states he will come in this week to update.   Okay to refill Cosentyx?

## 2021-01-31 NOTE — Progress Notes (Signed)
Office Visit Note  Patient: Colin Jensen             Date of Birth: Jun 27, 1985           MRN: [id number]             PCP: Curlene Labrum, MD Referring: Curlene Labrum, MD Visit Date: 02/14/2021 Occupation: @GUAROCC @  Subjective:  Recurrent infections  History of Present Illness: Colin Jensen is a 35 y.o. male with history of psoriatic arthritis and gout.  He is on cosentyx 300 mg sq injections every 28 days.  Patient reports that this fall he had several gaps in therapy while on Cosentyx due to recurrent infections.  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  He was diagnosed with COVID-19 in October 2022 at which time he ended up having to be off of Cosentyx for about 2 months.  He was experiencing increased joint pain and stiffness during that time.  He was prescribed a prednisone taper starting at 20 mg tapering by 5 mg every 4 days starting on 11/23/2020 which alleviated his symptoms.  His most recent dose of Cosentyx was 2 weeks ago.  He continues to have pain and stiffness in the left SI joint as well as both hips.  He denies any other joint pain or joint swelling at this time.  He denies any Achilles tendinitis or plantar fasciitis.  He has some psoriasis in the gluteal crease.  He denies any signs or symptoms of a gout flare.  He remains on allopurinol 300 mg daily.  He has not taken colchicine in over 2 years.  He avoids red meat, shellfish, and alcohol use.   Activities of Daily Living:  Patient reports morning stiffness for 1-2 hours.   Patient Denies nocturnal pain.  Difficulty dressing/grooming: Denies Difficulty climbing stairs: Denies Difficulty getting out of chair: Denies Difficulty using hands for taps, buttons, cutlery, and/or writing: Denies  Review of Systems  Constitutional:  Positive for fatigue.  HENT:  Negative for mouth sores, mouth dryness and nose dryness.   Eyes:  Positive for dryness. Negative for pain and itching.  Respiratory:   Negative for shortness of breath and difficulty breathing.   Cardiovascular:  Negative for chest pain and palpitations.  Gastrointestinal:  Negative for blood in stool, constipation and diarrhea.  Endocrine: Negative for increased urination.  Genitourinary:  Negative for difficulty urinating.  Musculoskeletal:  Positive for joint pain, joint pain, myalgias, morning stiffness, muscle tenderness and myalgias. Negative for joint swelling.  Skin:  Negative for color change, rash and redness.  Allergic/Immunologic: Positive for susceptible to infections.  Neurological:  Positive for numbness. Negative for dizziness, headaches, memory loss and weakness.  Hematological:  Negative for bruising/bleeding tendency.  Psychiatric/Behavioral:  Negative for confusion. The patient is nervous/anxious.    PMFS History:  Patient Active Problem List   Diagnosis Date Noted   History of obesity 05/01/2016   Psoriatic arthritis ([place]) 12/06/2015   Psoriasis 12/06/2015   High risk medication use 12/06/2015    Past Medical History:  Diagnosis Date   Gout    Psoriatic arthritis ([place])     Family History  Problem Relation Age of Onset   Heart attack Father    Psoriasis Father    Diabetes Sister    Healthy Son    History reviewed. No pertinent surgical history. Social History   Social History Narrative   Lives with family   Immunization History  Administered Date(s)  Administered   Influenza Inj Mdck Quad With Preservative 01/23/2018   PFIZER(Purple Top)SARS-COV-2 Vaccination 06/23/2019, 07/14/2019   Tdap 01/23/2018     Objective: Vital Signs: BP 117/86 (BP Location: Right Arm, Patient Position: Sitting, Cuff Size: Large)    Pulse 75    Ht 5\' 11"  (1.803 m)    Wt 239 lb 3.2 oz (108.5 kg)    BMI 33.36 kg/m    Physical Exam Vitals and nursing note reviewed.  Constitutional:      Appearance: He is well-developed.  HENT:     Head: Normocephalic and atraumatic.  Eyes:     Conjunctiva/sclera:  Conjunctivae normal.     Pupils: Pupils are equal, round, and reactive to light.  Pulmonary:     Effort: Pulmonary effort is normal.  Abdominal:     Palpations: Abdomen is soft.  Musculoskeletal:     Cervical back: Normal range of motion and neck supple.  Skin:    General: Skin is warm and dry.     Capillary Refill: Capillary refill takes less than 2 seconds.  Neurological:     Mental Status: He is alert and oriented to person, place, and time.  Psychiatric:        Behavior: Behavior normal.     Musculoskeletal Exam: C-spine has good range of motion.  Trapezius muscle tension and tenderness on the left side.  Some midline spinal tenderness in the thoracic region.  Tenderness over the left SI joint.  Shoulder joints, elbow joints, wrist joints, MCPs, PIPs, DIPs have good range of motion with no synovitis.  Complete fist formation bilaterally.  Painful range of motion of both hip joints.  Knee joints have good range of motion with no warmth or effusion.  Ankle joints have good range of motion with no tenderness or joint swelling.  No evidence of Achilles tendinitis.  CDAI Exam: CDAI Score: -- Patient Global: --; Provider Global: -- Swollen: --; Tender: -- Joint Exam 02/14/2021   No joint exam has been documented for this visit   There is currently no information documented on the homunculus. Go to the Rheumatology activity and complete the homunculus joint exam.  Investigation: No additional findings.  Imaging: No results found.  Recent Labs: Lab Results  Component Value Date   WBC 4.7 09/14/2020   HGB 15.3 09/14/2020   PLT 195 09/14/2020   NA 140 09/14/2020   K 4.4 09/14/2020   CL 101 09/14/2020   CO2 33 (H) 09/14/2020   GLUCOSE 76 09/14/2020   BUN 13 09/14/2020   CREATININE 0.78 09/14/2020   BILITOT 1.2 09/14/2020   ALKPHOS 46 05/07/2016   AST 38 09/14/2020   ALT 56 (H) 09/14/2020   PROT 7.3 09/14/2020   ALBUMIN 4.2 05/07/2016   CALCIUM 9.6 09/14/2020   GFRAA  146 04/21/2020   QFTBGOLDPLUS NEGATIVE 11/19/2019    Speciality Comments: No specialty comments available.  Procedures:  No procedures performed Allergies: Nickel, Ibuprofen, and Penicillins     Assessment / Plan:     Visit Diagnoses: Psoriatic arthritis ([place]): He has no synovitis or dactylitis on examination today.  He is experiencing increased pain and stiffness in the left SI joint and in both hips.  He is currently on Cosentyx 300 mg subcutaneous injections every 28 days.  He has had several interruptions in therapy due to several infections during the fall.  He was diagnosed with COVID-19 in October and ended up being off of Cosentyx for about 2 months.  He was prescribed a prednisone  taper in October which alleviated the increased joint pain and stiffness he was experiencing.  He is not exhibiting any signs or symptoms of an infection at this time.  His most recent Cosentyx dose was 2 weeks ago.  He requested a prednisone taper to alleviate his left SI joint and hip pain.  A short low-dose prednisone taper starting at 20 mg tapering by 5 mg every 2 days was sent to the pharmacy.  He will remain on Cosentyx as prescribed.  He was advised to notify us if he continues to have recurrent infections.  He was strongly encouraged to continue to take vitamin D, vitamin C, and zinc.  He was also advised to notify us if he continues to have persistent joint pain and stiffness.  He will follow-up in the office in 5 months or sooner if necessary.  Psoriasis: Gluteal crease. Unchanged.  He will remain on cosentyx as prescribed.   High risk medication use - Cosentyx 300 mg sq injection every 28 days (started on 08/06/18). CBC and CMP updated on 09/14/2020.  He is overdue to update lab work.  Orders for CBC and CMP were released.  His next lab work will be due in April and every 3 months to monitor for drug toxicity.  Standing orders for CBC and CMP remain in place.  TB Gold negative on 11/19/2019.  Order for TB  gold released today.- Plan: CBC with Differential/Platelet, COMPLETE METABOLIC PANEL WITH GFR He had several infections during the fall requiring several gaps in therapy.  His most recent injection of Cosentyx was 2 weeks ago.  He has been taking zinc, vitamin C, and vitamin D as advised.  Discussed the importance of holding Cosentyx if he develops signs or symptoms of an infection and to resume once the infection has completely cleared.  Screening for tuberculosis -Order for TB gold was released today.  Plan: QuantiFERON-TB Gold Plus  Chronic left shoulder pain -MRI of the left shoulder obtained on 10/05/2020.  Tendinosis and fraying of the long head biceps tendon as it enters the bicipital groove was noted.  Mild irregularity of the superior labrum at the biceps labral anchor. No evidence of rotator cuff tear.   He has dry needling performed on a regular basis.   He has good ROM of the left shoulder on examination today.  Some tenderness to palpation.   Idiopathic chronic gout of right ankle without tophus -He has not had any signs or symptoms of a gout flare.  He remains on allopurinol 300 mg 1 tablet daily for management of gout.  He is tolerating allopurinol without any side effects and has not missed any doses recently.  He has not taken colchicine in over 2 years.  Uric acid was within the desirable range: 5.9 on 09/14/2020.  Uric acid level will be rechecked today.  He will remain on the current treatment regimen.  He was advised to notify us if he develops signs or symptoms of a gout flare.  Dietary recommendations were discussed today in detail and all questions were addressed. - Plan: Uric acid  Other medical conditions are listed as follows:   Vitamin D deficiency: He is taking vitamin 4,000 units daily.   Trapezius muscle spasm - He goes for dry needling on a regular basis.  He has an appointment today.   Neuralgia  Family history of diabetes mellitus: Patient's hemoglobin A1c was 4.6%  on 09/14/2020.  Other fatigue -Patient requested to have TSH checked today.  Plan: TSH  Orders: Orders Placed This Encounter  Procedures   CBC with Differential/Platelet   COMPLETE METABOLIC PANEL WITH GFR   Uric acid   QuantiFERON-TB Gold Plus   TSH   Meds ordered this encounter  Medications   predniSONE (DELTASONE) 5 MG tablet    Sig: Take 4 tablets by mouth daily x2 days, 3 tablets daily x2 days, 2 tablets daily x2 days, 1 tablet daily x2 days.    Dispense:  20 tablet    Refill:  0    Follow-Up Instructions: Return in about 5 months (around 07/15/2021) for Psoriatic arthritis, Gout.   Ofilia Neas, PA-C  Note - This record has been created using Dragon software.  Chart creation errors have been sought, but may not always  have been located. Such creation errors do not reflect on  the standard of medical care.

## 2021-02-14 ENCOUNTER — Other Ambulatory Visit: Payer: Self-pay

## 2021-02-14 ENCOUNTER — Ambulatory Visit: Payer: BC Managed Care – PPO | Admitting: Physician Assistant

## 2021-02-14 ENCOUNTER — Encounter: Payer: Self-pay | Admitting: Physician Assistant

## 2021-02-14 VITALS — BP 117/86 | HR 75 | Ht 71.0 in | Wt 239.2 lb

## 2021-02-14 DIAGNOSIS — G8929 Other chronic pain: Secondary | ICD-10-CM

## 2021-02-14 DIAGNOSIS — Z79899 Other long term (current) drug therapy: Secondary | ICD-10-CM

## 2021-02-14 DIAGNOSIS — L4050 Arthropathic psoriasis, unspecified: Secondary | ICD-10-CM

## 2021-02-14 DIAGNOSIS — E559 Vitamin D deficiency, unspecified: Secondary | ICD-10-CM

## 2021-02-14 DIAGNOSIS — Z111 Encounter for screening for respiratory tuberculosis: Secondary | ICD-10-CM

## 2021-02-14 DIAGNOSIS — M25512 Pain in left shoulder: Secondary | ICD-10-CM | POA: Diagnosis not present

## 2021-02-14 DIAGNOSIS — M1A0710 Idiopathic chronic gout, right ankle and foot, without tophus (tophi): Secondary | ICD-10-CM

## 2021-02-14 DIAGNOSIS — Z833 Family history of diabetes mellitus: Secondary | ICD-10-CM

## 2021-02-14 DIAGNOSIS — M792 Neuralgia and neuritis, unspecified: Secondary | ICD-10-CM

## 2021-02-14 DIAGNOSIS — L409 Psoriasis, unspecified: Secondary | ICD-10-CM | POA: Diagnosis not present

## 2021-02-14 DIAGNOSIS — R5383 Other fatigue: Secondary | ICD-10-CM

## 2021-02-14 DIAGNOSIS — M62838 Other muscle spasm: Secondary | ICD-10-CM

## 2021-02-14 MED ORDER — PREDNISONE 5 MG PO TABS: ORAL_TABLET | ORAL | 0 refills | Status: DC

## 2021-02-14 NOTE — Patient Instructions (Addendum)
Standing Labs We placed an order today for your standing lab work.   Please have your standing labs drawn in April and every 3 months   If possible, please have your labs drawn 2 weeks prior to your appointment so that the provider can discuss your results at your appointment.  Please note that you may see your imaging and lab results in [facility] before we have reviewed them. We may be awaiting multiple results to interpret others before contacting you. Please allow our office up to 72 hours to thoroughly review all of the results before contacting the office for clarification of your results.  We have open lab daily: Monday through Thursday from 1:30-4:30 PM and Friday from 1:30-4:00 PM at the office of Dr. Bo Merino, [facility].   Please be advised, all patients with office appointments requiring lab work will take precedent over walk-in lab work.  If possible, please come for your lab work on Monday and Friday afternoons, as you may experience shorter wait times. The office is located at [address] [place], [place], [place] [address] No appointment is necessary.   Labs are drawn by [facility]. Please bring your co-pay at the time of your lab draw.  You may receive a bill from [facility] for your lab work.  If you wish to have your labs drawn at another location, please call the office 24 hours in advance to send orders.  If you have any questions regarding directions or hours of operation,  please call [phone].   As a reminder, please drink plenty of water prior to coming for your lab work. Thanks!  Information for patients with Gout  Gout defined-Gout occurs when urate crystals accumulate in your joint causing the inflammation and intense pain of gout attack.  Urate crystals can form when you have high levels of uric acid in your blood.  Your body produces uric acid when it breaks down prurines-substances that are found naturally in your body, as well as in  certain foods such as organ meats, anchioves, herring, asparagus, and mushrooms.  Normally uric acid dissolves in your blood and passes through your kidneys into your urine.  But sometimes your body either produces too much uric acid or your kidneys excrete too little uric acid.  When this happens, uric acid can build up, forming sharp needle-like urate crystals in a joint or surrounding tissue that cause pain, inflammation and swelling.    Gout is characterized by sudden, severe attacks of pain, redness and tenderness in joints, often the joint at the base of the big toe.  Gout is complex form of arthritis that can affect anyone.  Men are more likely to get gout but women become increasingly more susceptible to gout after menopause.  An acute attack of gout can wake you up in the middle of the night with the sensation that your big toe is on fire.  The affected joint is hot, swollen and so tender that even the weight or the sheet on it may seem intolerable.  If you experience symptoms of an acute gout attack it is important to your doctor as soon as the symptoms start.  Gout that goes untreated can lead to worsening pain and joint damage.  Risk Factors:  You are more likely to develop gout if you have high levels of uric acid in your body.    Factors that increase the uric acid level in your body include:  Lifestyle factors.  Excessive alcohol use-generally more than  two drinks a day for men and more than one for women increase the risk of gout.  Medical conditions.  Certain conditions make it more likely that you will develop gout.  These include hypertension, and chronic conditions such as diabetes, high levels of fat and cholesterol in the blood, and narrowing of the arteries.  Certain medications.  The uses of Thiazide diuretics- commonly used to treat hypertension and low dose aspirin can also increase uric acid levels.  Family history of gout.  If other members of your family have had  gout, you are more likely to develop the disease.  Age and sex. Gout occurs more often in men than it does in women, primarily because women tend to have lower uric acid levels than men do.  Men are more likely to develop gout earlier usually between the ages of 39-50- whereas women generally develop signs and symptoms after menopause.    Tests and diagnosis:  Tests to help diagnose gout may include:  Blood test.  Your doctor may recommend a blood test to measure the uric acid level in your blood .  Blood tests can be misleading, though.  Some people have high uric acid levels but never experience gout.  And some people have signs and symptoms of gout, but don't have unusual levels of uric acid in their blood.  Joint fluid test.  Your doctor may use a needle to draw fluid from your affected joint.  When examined under the microscope, your joint fluid may reveal urate crystals.  Treatment:  Treatment for gout usually involves medications.  What medications you and your doctor choose will be based on your current health and other medications you currently take.  Gout medications can be used to treat acute gout attacks and prevent future attacks as well as reduce your risk of complications from gout such as the development of tophi from urate crystal deposits.  Alternative medicine:   Certain foods have been studied for their potential to lower uric acid levels, including:  Coffee.  Studies have found an association between coffee drinking (regular and decaf) and lower uric acid levels.  The evidence is not enough to encourage non-coffee drinkers to start, but it may give clues to new ways of treating gout in the future.  Vitamin C.  Supplements containing vitamin C may reduce the levels of uric acid in your blood.  However, vitamin as a treatment for gout. Don't assume that if a little vitamin C is good, than lots is better.  Megadoses of vitamin C may increase your bodies uric acid  levels.  Cherries.  Cherries have been associated with lower levels of uric acid in studies, but it isn't clear if they have any effect on gout signs and symptoms.  Eating more cherries and other dar-colored fruits, such as blackberries, blueberries, purple grapes and raspberries, may be a safe way to support your gout treatment.    Lifestyle/Diet Recommendations:  Drink 8 to 16 cups ( about 2 to 4 liters) of fluid each day, with at least half being water. Avoid alcohol Eat a moderate amount of protein, preferably from healthy sources, such as low-fat or fat-free dairy, tofu, eggs, and nut butters. Limit you daily intake of meat, fish, and poultry to 4 to 6 ounces. Avoid high fat meats and desserts. Decrease you intake of shellfish, beef, lamb, pork, eggs and cheese. Choose a good source of vitamin C daily such as citrus fruits, strawberries, broccoli,  brussel sprouts, papaya, and cantaloupe.  Choose a good source of vitamin A every other day such as yellow fruits, or dark green/yellow vegetables. Avoid drastic weigh reduction or fasting.  If weigh loss is desired lose it over a period of several months. See "dietary considerations.." chart for specific food recommendations.  Dietary Considerations for people with Gout  Food with negligible purine content (0-15 mg of purine nitrogen per 100 grams food)  May use as desired except on calorie variations  Non fat milk Cocoa Cereals (except in list II) Hard candies  Buttermilk Carbonated drinks Vegetables (except in list II) Sherbet  Coffee Fruits Sugar Honey  Tea Cottage Cheese Gelatin-jell-o Salt  Fruit juice Breads Angel food Cake   Herbs/spices Jams/Jellies [employer]    Foods that do not contain excessive purine content, but must be limited due to fat content  Cream Eggs Oil and Salad Dressing  Half and Half Peanut Butter Chocolate  Whole Milk Cakes Potato Chips  Butter Ice Cream Fried Foods  Cheese Nuts Waffles,  pancakes   List II: Food with moderate purine content (50-150 mg of purine nitrogen per 100 grams of food)  Limit total amount each day to 5 oz. cooked Lean meat, other than those on list III   Poultry, other than those on list III Fish, other than those on list III   Seafood, other than those on list III  These foods may be used occasionally  Peas Lentils Bran  Spinach Oatmeal Dried Beans and Peas  Asparagus Wheat Germ Mushrooms   Additional information about meat choices  Choose fish and poultry, particularly without skin, often.  Select lean, well trimmed cuts of meat.  Avoid all fatty meats, bacon , sausage, fried meats, fried fish, or poultry, luncheon meats, cold cuts, hot dogs, meats canned or frozen in gravy, spareribs and frozen and packaged prepared meats.   List III: Foods with HIGH purine content / Foods to AVOID (150-800 mg of purine nitrogen per 100 grams of food)  Anchovies Herring Meat Broths  Liver Mackerel Meat Extracts  Kidney Scallops Meat Drippings  Sardines Wild Game Mincemeat  Sweetbreads Goose Gravy  Heart Tongue Yeast, baker's and brewers   Commercial soups made with any of the foods listed in List II or List III  In addition avoid all alcoholic beverages

## 2021-02-15 NOTE — Progress Notes (Signed)
CBC and CMP WNL.   TSH WNL.   Uric acid is 6.6.  Ideally his uric acid should be less than 6.  Please clarify if he has been taking allopurinol as prescribed.  Please discuss the importance of avoiding a purine rich diet.

## 2021-02-17 LAB — CBC WITH DIFFERENTIAL/PLATELET
Absolute Monocytes: 432 cells/uL (ref 200–950)
Basophils Absolute: 82 cells/uL (ref 0–200)
Basophils Relative: 1.7 %
Eosinophils Absolute: 82 cells/uL (ref 15–500)
Eosinophils Relative: 1.7 %
HCT: 46.3 % (ref 38.5–50.0)
Hemoglobin: 15.7 g/dL (ref 13.2–17.1)
Lymphs Abs: 1382 cells/uL (ref 850–3900)
MCH: 31.1 pg (ref 27.0–33.0)
MCHC: 33.9 g/dL (ref 32.0–36.0)
MCV: 91.7 fL (ref 80.0–100.0)
MPV: 10.7 fL (ref 7.5–12.5)
Monocytes Relative: 9 %
Neutro Abs: 2822 cells/uL (ref 1500–7800)
Neutrophils Relative %: 58.8 %
Platelets: 211 10*3/uL (ref 140–400)
RBC: 5.05 10*6/uL (ref 4.20–5.80)
RDW: 12.7 % (ref 11.0–15.0)
Total Lymphocyte: 28.8 %
WBC: 4.8 10*3/uL (ref 3.8–10.8)

## 2021-02-17 LAB — QUANTIFERON-TB GOLD PLUS
Mitogen-NIL: >10 IU/mL
NIL: 0.05 IU/mL
QuantiFERON-TB Gold Plus: NEGATIVE
TB1-NIL: 0.01 IU/mL
TB2-NIL: 0 IU/mL

## 2021-02-17 LAB — COMPLETE METABOLIC PANEL WITH GFR
AG Ratio: 1.7 (calc) (ref 1.0–2.5)
ALT: 40 U/L (ref 9–46)
AST: 28 U/L (ref 10–40)
Albumin: 4.7 g/dL (ref 3.6–5.1)
Alkaline phosphatase (APISO): 56 U/L (ref 36–130)
BUN: 11 mg/dL (ref 7–25)
CO2: 33 mmol/L — ABNORMAL HIGH (ref 20–32)
Calcium: 9.6 mg/dL (ref 8.6–10.3)
Chloride: 100 mmol/L (ref 98–110)
Creat: 0.76 mg/dL (ref 0.60–1.26)
Globulin: 2.7 g/dL (calc) (ref 1.9–3.7)
Glucose, Bld: 86 mg/dL (ref 65–99)
Potassium: 4.6 mmol/L (ref 3.5–5.3)
Sodium: 139 mmol/L (ref 135–146)
Total Bilirubin: 1 mg/dL (ref 0.2–1.2)
Total Protein: 7.4 g/dL (ref 6.1–8.1)
eGFR: 119 mL/min/{1.73_m2} (ref >=60)

## 2021-02-17 LAB — TSH: TSH: 2.94 mIU/L (ref 0.40–4.50)

## 2021-02-17 LAB — URIC ACID: Uric Acid, Serum: 6.6 mg/dL (ref 4.0–8.0)

## 2021-02-19 NOTE — Progress Notes (Signed)
TB gold negative

## 2021-03-23 ENCOUNTER — Other Ambulatory Visit: Payer: Self-pay | Admitting: Rheumatology

## 2021-03-23 DIAGNOSIS — M1A0710 Idiopathic chronic gout, right ankle and foot, without tophus (tophi): Secondary | ICD-10-CM

## 2021-03-26 NOTE — Telephone Encounter (Signed)
Next Visit: 07/19/2021  Last Visit: 02/14/2021  Last Fill: 08/03/2020  DX: Idiopathic chronic gout of right ankle without tophus   Current Dose per office note 02/14/2021: allopurinol 300 mg 1 tablet daily for management of gout  Labs: 02/14/2021 CBC and CMP WNL.  Uric acid is 6.6.   Okay to refill Allopurinol?

## 2021-03-28 NOTE — Telephone Encounter (Signed)
The long term use of prednisone is not recommended.   Please recommend the use of voltaren gel PRN.   He may also want to use arthritis compression gloves at night.   He should schedule a sooner office visit for further evaluation if his symptoms persist or worsen.

## 2021-05-08 ENCOUNTER — Other Ambulatory Visit: Payer: Self-pay | Admitting: Physician Assistant

## 2021-05-08 DIAGNOSIS — L409 Psoriasis, unspecified: Secondary | ICD-10-CM

## 2021-05-08 DIAGNOSIS — L4050 Arthropathic psoriasis, unspecified: Secondary | ICD-10-CM

## 2021-05-08 NOTE — Telephone Encounter (Signed)
Next Visit: 07/19/2021 ?  ?Last Visit: 02/14/2021 ?  ?Last Fill: 01/09/2022 ?  ?DX: Psoriatic arthritis  ?  ?Current Dose per office note 02/14/2021: Cosentyx 300 mg sq injection every 28 days ?  ?Labs: 02/14/2021 CBC and CMP WNL.  Uric acid is 6.6.  ? ?TB Gold: 02/14/2021 Neg  ?  ?Okay to refill Cosentyx? ?

## 2021-06-12 ENCOUNTER — Telehealth: Payer: Self-pay | Admitting: Pharmacist

## 2021-06-12 NOTE — Telephone Encounter (Signed)
Faxed prior auth renewal form to [facility] for Cosentyx ? ?Fax: [phone] ?Phone: [phone] ?Case ID: [id number] ? ?Chesley Mires, PharmD, MPH, BCPS, CPP ?Clinical Pharmacist (Rheumatology and Pulmonology) ?

## 2021-06-13 NOTE — Telephone Encounter (Signed)
Received notification from [facility] regarding a prior authorization for [place]. Authorization has been APPROVED from 06/12/21 to 06/13/22.  ? ?Patient must continue to fill through [facility]: [id number] ? ?Authorization # [id number] ? ?Knox Saliva, PharmD, MPH, BCPS, CPP ?Clinical Pharmacist (Rheumatology and Pulmonology) ? ?

## 2021-06-21 ENCOUNTER — Other Ambulatory Visit: Payer: Self-pay | Admitting: Physician Assistant

## 2021-06-21 DIAGNOSIS — M1A0710 Idiopathic chronic gout, right ankle and foot, without tophus (tophi): Secondary | ICD-10-CM

## 2021-06-21 NOTE — Telephone Encounter (Signed)
Next Visit: 07/19/2021   Last Visit: 02/14/2021   Last Fill: 03/26/2021  DX: Idiopathic chronic gout of right ankle without tophus    Current Dose per office note 02/14/2021: allopurinol 300 mg 1 tablet daily   Labs: 02/14/2021 CBC and CMP WNL.  Uric acid is 6.6.   Okay to refill Allopurinol?

## 2021-07-05 NOTE — Progress Notes (Signed)
Office Visit Note  Patient: Colin Jensen             Date of Birth: 1985-09-27           MRN: [id number]             PCP: Juliette Alcide, MD Referring: Juliette Alcide, MD Visit Date: 07/19/2021 Occupation: @GUAROCC @  Subjective:  Medication management  History of Present Illness: Colin Jensen is a 36 y.o. male with history of psoriatic arthritis, psoriasis and gout.  He denies any psoriasis lesions.  He states that he was diagnosed with labral tear in his left shoulder in September 2022.  He was evaluated by Dr. October 2022.  He was advised surgery.  He tried physical therapy and dry needling which is helping.  He has some discomfort occasionally in his left shoulder.  He also had some discomfort in his left knee joint which was most likely tendinitis and it resolved by itself.  None of the joints have been swollen.  Denies any history of Achilles tendinitis or planter fasciitis.  There is no history of uveitis.  He had no gout flare.  He states he forgets to take allopurinol at times.  Activities of Daily Living:  Patient reports morning stiffness for 15 minutes.   Patient Denies nocturnal pain.  Difficulty dressing/grooming: Denies Difficulty climbing stairs: Denies Difficulty getting out of chair: Denies Difficulty using hands for taps, buttons, cutlery, and/or writing: Denies  Review of Systems  Constitutional:  Positive for fatigue.  HENT:  Negative for mouth dryness.   Eyes:  Negative for dryness.  Respiratory:  Negative for shortness of breath.   Cardiovascular:  Negative for swelling in legs/feet.  Gastrointestinal:  Negative for constipation.  Endocrine: Negative for excessive thirst.  Genitourinary:  Negative for difficulty urinating.  Musculoskeletal:  Positive for morning stiffness.  Skin:  Negative for rash and sensitivity to sunlight.  Allergic/Immunologic: Negative for susceptible to infections.  Neurological:  Negative for numbness.  Hematological:  Negative  for bruising/bleeding tendency.  Psychiatric/Behavioral:  Negative for sleep disturbance.     PMFS History:  Patient Active Problem List   Diagnosis Date Noted   History of obesity 05/01/2016   Psoriatic arthritis (HCC) 12/06/2015   Psoriasis 12/06/2015   High risk medication use 12/06/2015    Past Medical History:  Diagnosis Date   Gout    Psoriatic arthritis (HCC)     Family History  Problem Relation Age of Onset   Heart attack Father    Psoriasis Father    Diabetes Sister    Healthy Son    History reviewed. No pertinent surgical history. Social History   Social History Narrative   Lives with family   Immunization History  Administered Date(s) Administered   Influenza Inj Mdck Quad With Preservative 01/23/2018   PFIZER(Purple Top)SARS-COV-2 Vaccination 06/23/2019, 07/14/2019   Tdap 01/23/2018     Objective: Vital Signs: BP 118/75 (BP Location: Left Arm, Patient Position: Sitting, Cuff Size: Normal)   Pulse 67   Resp 15   Ht 5\' 11"  (1.803 m)   Wt 248 lb 6.4 oz (112.7 kg)   BMI 34.64 kg/m    Physical Exam Vitals and nursing note reviewed.  Constitutional:      Appearance: He is well-developed.  HENT:     Head: Normocephalic and atraumatic.  Eyes:     Conjunctiva/sclera: Conjunctivae normal.     Pupils: Pupils are equal, round, and reactive to light.  Cardiovascular:     Rate  and Rhythm: Normal rate and regular rhythm.     Heart sounds: Normal heart sounds.  Pulmonary:     Effort: Pulmonary effort is normal.     Breath sounds: Normal breath sounds.  Abdominal:     General: Bowel sounds are normal.     Palpations: Abdomen is soft.  Musculoskeletal:     Cervical back: Normal range of motion and neck supple.  Skin:    General: Skin is warm and dry.     Capillary Refill: Capillary refill takes less than 2 seconds.  Neurological:     Mental Status: He is alert and oriented to person, place, and time.  Psychiatric:        Behavior: Behavior normal.       Musculoskeletal Exam: C-spine thoracic and lumbar spine were in good range of motion.  There was no SI joint tenderness.  He had some discomfort with internal rotation of his left shoulder.  Shoulder joints, elbow joints, wrist joints, MCPs PIPs and DIPs with good range of motion with no synovitis.  Hip joints, knee joints, ankles, MTPs and PIPs with good range of motion with no synovitis.  There was no evidence of Planter fasciitis, Achilles tendinitis.  CDAI Exam: CDAI Score: -- Patient Global: --; Provider Global: -- Swollen: --; Tender: -- Joint Exam 07/19/2021   No joint exam has been documented for this visit   There is currently no information documented on the homunculus. Go to the Rheumatology activity and complete the homunculus joint exam.  Investigation: No additional findings.  Imaging: No results found.  Recent Labs: Lab Results  Component Value Date   WBC 4.8 02/14/2021   HGB 15.7 02/14/2021   PLT 211 02/14/2021   NA 139 02/14/2021   K 4.6 02/14/2021   CL 100 02/14/2021   CO2 33 (H) 02/14/2021   GLUCOSE 86 02/14/2021   BUN 11 02/14/2021   CREATININE 0.76 02/14/2021   BILITOT 1.0 02/14/2021   ALKPHOS 46 05/07/2016   AST 28 02/14/2021   ALT 40 02/14/2021   PROT 7.4 02/14/2021   ALBUMIN 4.2 05/07/2016   CALCIUM 9.6 02/14/2021   GFRAA 146 04/21/2020   QFTBGOLDPLUS NEGATIVE 02/14/2021    Speciality Comments: No specialty comments available.  Procedures:  No procedures performed Allergies: Nickel, Ibuprofen, and Penicillins   Assessment / Plan:     Visit Diagnoses: Psoriatic arthritis (HCC)-he had no active synovitis on examination.  He is on Cosentyx 300 mg subcu every 28 days.  He has been taking Cosentyx on a regular basis.  He denies any side effects from Cosentyx.  He denies any joint pain or joint swelling.  He has some discomfort in his left shoulder due to previous labral tear.  There is no history of Achilles tendinitis or planter fasciitis.   He denies any history of uveitis.    Psoriasis -he denies any lesions of psoriasis today.  High risk medication use - Cosentyx 300 mg sq injection every 28 days (started on 08/06/18). -Labs obtained on February 14, 2021 were reviewed CBC with differential, CMP with GFR were normal.  TB gold was negative on February 14, 2021.  He has been advised to get annual TB Gold.  He will get labs every 3 months.  Plan: CBC with Differential/Platelet, COMPLETE METABOLIC PANEL WITH GFR.  Information about immunization was placed in the AVS.  He was also advised to hold Cosyntex if he develops an infection and resume after the infection resolves.  Chronic left shoulder pain -  MRI of the left shoulder obtained on 10/05/2020.  MRI was consistent with labral tear.  He had physical therapy dry needling which has been helpful.  He had good range of motion with some discomfort with internal rotation.  Acute left knee pain-patient states that he developed some discomfort over the lateral aspect of his knee after playing golf.  Symptoms resolved after stretching.  He had no tenderness on the palpation today.  Idiopathic chronic gout of right ankle without tophus - allopurinol 300 mg 1 tablet daily for management of gout. uric acid: 02/14/2021 6.6 -patient states that he forgets to take allopurinol every night.  He will try to take it in the morning.  He has not had any gout flares since the last visit.  Plan: Uric acid  Vitamin D deficiency-vitamin D level on April 18, 2020 was 38.  He has been taking vitamin D supplement.  Increased risk of heart disease with psoriatic arthritis was discussed.  Dietary modifications and exercise was emphasized.  Trapezius muscle spasm - He goes for dry needling on a regular basis  Neuralgia  Other fatigue-he continues to have some fatigue.  Family history of diabetes mellitus  Orders: Orders Placed This Encounter  Procedures   CBC with Differential/Platelet   COMPLETE METABOLIC  PANEL WITH GFR   Uric acid   No orders of the defined types were placed in this encounter.   Follow-Up Instructions: Return in about 5 months (around 12/19/2021) for Psoriatic arthritis, Gout.   Colin SavoyShaili Shaneta Cervenka, MD  Note - This record has been created using Animal nutritionistDragon software.  Chart creation errors have been sought, but may not always  have been located. Such creation errors do not reflect on  the standard of medical care.

## 2021-07-19 ENCOUNTER — Encounter: Payer: Self-pay | Admitting: Rheumatology

## 2021-07-19 ENCOUNTER — Ambulatory Visit (INDEPENDENT_AMBULATORY_CARE_PROVIDER_SITE_OTHER): Payer: BC Managed Care – PPO | Admitting: Rheumatology

## 2021-07-19 VITALS — BP 118/75 | HR 67 | Resp 15 | Ht 71.0 in | Wt 248.4 lb

## 2021-07-19 DIAGNOSIS — L4050 Arthropathic psoriasis, unspecified: Secondary | ICD-10-CM

## 2021-07-19 DIAGNOSIS — M25512 Pain in left shoulder: Secondary | ICD-10-CM

## 2021-07-19 DIAGNOSIS — M792 Neuralgia and neuritis, unspecified: Secondary | ICD-10-CM

## 2021-07-19 DIAGNOSIS — E559 Vitamin D deficiency, unspecified: Secondary | ICD-10-CM

## 2021-07-19 DIAGNOSIS — Z79899 Other long term (current) drug therapy: Secondary | ICD-10-CM | POA: Diagnosis not present

## 2021-07-19 DIAGNOSIS — L409 Psoriasis, unspecified: Secondary | ICD-10-CM | POA: Diagnosis not present

## 2021-07-19 DIAGNOSIS — Z833 Family history of diabetes mellitus: Secondary | ICD-10-CM

## 2021-07-19 DIAGNOSIS — R5383 Other fatigue: Secondary | ICD-10-CM

## 2021-07-19 DIAGNOSIS — M62838 Other muscle spasm: Secondary | ICD-10-CM

## 2021-07-19 DIAGNOSIS — M1A0710 Idiopathic chronic gout, right ankle and foot, without tophus (tophi): Secondary | ICD-10-CM

## 2021-07-19 DIAGNOSIS — M25562 Pain in left knee: Secondary | ICD-10-CM

## 2021-07-19 DIAGNOSIS — G8929 Other chronic pain: Secondary | ICD-10-CM

## 2021-07-19 NOTE — Patient Instructions (Signed)
Standing Labs We placed an order today for your standing lab work.   Please have your standing labs drawn in September and every 3 months  If possible, please have your labs drawn 2 weeks prior to your appointment so that the provider can discuss your results at your appointment.  Please note that you may see your imaging and lab results in MyChart before we have reviewed them. We may be awaiting multiple results to interpret others before contacting you. Please allow our office up to 72 hours to thoroughly review all of the results before contacting the office for clarification of your results.  We have open lab daily: Monday through Thursday from 1:30-4:30 PM and Friday from 1:30-4:00 PM at the office of Dr. Adonias Demore, [facility].   Please be advised, all patients with office appointments requiring lab work will take precedent over walk-in lab work.  If possible, please come for your lab work on Monday and Friday afternoons, as you may experience shorter wait times. The office is located at [address], [address], [place], [place] [address] No appointment is necessary.   Labs are drawn by [facility]. Please bring your co-pay at the time of your lab draw.  You may receive a bill from [facility] for your lab work.  Please note if you are on Hydroxychloroquine and and an order has been placed for a Hydroxychloroquine level, you will need to have it drawn 4 hours or more after your last dose.  If you wish to have your labs drawn at another location, please call the office 24 hours in advance to send orders.  If you have any questions regarding directions or hours of operation,  please call [phone].   As a reminder, please drink plenty of water prior to coming for your lab work. Thanks!   Vaccines You are taking a medication(s) that can suppress your immune system.  The following immunizations are recommended: Flu annually Covid-19  Td/Tdap (tetanus, diphtheria,  pertussis) every 10 years Pneumonia (Prevnar 15 then Pneumovax 23 at least 1 year apart.  Alternatively, can take Prevnar 20 without needing additional dose) Shingrix: 2 doses from 4 weeks to 6 months apart  Please check with your PCP to make sure you are up to date.   If you have signs or symptoms of an infection or start antibiotics: First, call your PCP for workup of your infection. Hold your medication through the infection, until you complete your antibiotics, and until symptoms resolve if you take the following: Injectable medication (Actemra, Benlysta, Cimzia, Cosentyx, Enbrel, Humira, Kevzara, Orencia, Remicade, Simponi, Stelara, Taltz, Tremfya) Methotrexate Leflunomide (Arava) Mycophenolate (Cellcept) Xeljanz, Olumiant, or Rinvoq  

## 2021-07-20 LAB — CBC WITH DIFFERENTIAL/PLATELET
Absolute Monocytes: 497 cells/uL (ref 200–950)
Basophils Absolute: 51 cells/uL (ref 0–200)
Basophils Relative: 1.1 %
Eosinophils Absolute: 87 cells/uL (ref 15–500)
Eosinophils Relative: 1.9 %
HCT: 46.5 % (ref 38.5–50.0)
Hemoglobin: 15.8 g/dL (ref 13.2–17.1)
Lymphs Abs: 1633 cells/uL (ref 850–3900)
MCH: 31.3 pg (ref 27.0–33.0)
MCHC: 34 g/dL (ref 32.0–36.0)
MCV: 92.1 fL (ref 80.0–100.0)
MPV: 10.6 fL (ref 7.5–12.5)
Monocytes Relative: 10.8 %
Neutro Abs: 2332 cells/uL (ref 1500–7800)
Neutrophils Relative %: 50.7 %
Platelets: 183 10*3/uL (ref 140–400)
RBC: 5.05 10*6/uL (ref 4.20–5.80)
RDW: 12.6 % (ref 11.0–15.0)
Total Lymphocyte: 35.5 %
WBC: 4.6 10*3/uL (ref 3.8–10.8)

## 2021-07-20 LAB — COMPLETE METABOLIC PANEL WITH GFR
AG Ratio: 2 (calc) (ref 1.0–2.5)
ALT: 27 U/L (ref 9–46)
AST: 24 U/L (ref 10–40)
Albumin: 4.8 g/dL (ref 3.6–5.1)
Alkaline phosphatase (APISO): 51 U/L (ref 36–130)
BUN: 14 mg/dL (ref 7–25)
CO2: 29 mmol/L (ref 20–32)
Calcium: 9.4 mg/dL (ref 8.6–10.3)
Chloride: 100 mmol/L (ref 98–110)
Creat: 0.84 mg/dL (ref 0.60–1.26)
Globulin: 2.4 g/dL (calc) (ref 1.9–3.7)
Glucose, Bld: 80 mg/dL (ref 65–99)
Potassium: 4.1 mmol/L (ref 3.5–5.3)
Sodium: 139 mmol/L (ref 135–146)
Total Bilirubin: 0.9 mg/dL (ref 0.2–1.2)
Total Protein: 7.2 g/dL (ref 6.1–8.1)
eGFR: 115 mL/min/{1.73_m2} (ref >=60)

## 2021-07-20 LAB — URIC ACID: Uric Acid, Serum: 7.4 mg/dL (ref 4.0–8.0)

## 2021-07-20 NOTE — Progress Notes (Signed)
CBC and CMP are normal.  Uric acid is elevated at 7.4.  Please advise patient to take allopurinol on a regular basis.  He should also avoid red meat, shellfish and alcohol use.

## 2021-09-29 ENCOUNTER — Other Ambulatory Visit: Payer: Self-pay | Admitting: Physician Assistant

## 2021-09-29 DIAGNOSIS — M1A0710 Idiopathic chronic gout, right ankle and foot, without tophus (tophi): Secondary | ICD-10-CM

## 2021-10-01 NOTE — Telephone Encounter (Signed)
Next Visit: 12/19/2021  Last Visit: 07/19/2021  Last Fill: 06/21/2021  DX: Idiopathic chronic gout of right ankle without tophus   Current Dose per office note 07/19/2021: allopurinol 300 mg 1 tablet daily for management of gout  Labs: 07/19/2021 CBC and CMP are normal.  Uric acid is elevated at 7.4.    Okay to refill Allopurinol?

## 2021-11-12 ENCOUNTER — Other Ambulatory Visit: Payer: Self-pay | Admitting: Physician Assistant

## 2021-11-12 DIAGNOSIS — L4050 Arthropathic psoriasis, unspecified: Secondary | ICD-10-CM

## 2021-11-12 DIAGNOSIS — Z79899 Other long term (current) drug therapy: Secondary | ICD-10-CM

## 2021-11-12 DIAGNOSIS — L409 Psoriasis, unspecified: Secondary | ICD-10-CM

## 2021-11-12 NOTE — Telephone Encounter (Signed)
Next Visit: 12/19/2021   Last Visit: 07/19/2021   Last Fill: 05/08/2021  DX: Psoriatic arthritis    Current Dose per office note 07/19/2021: Cosentyx 300 mg sq injection every 28 days   Labs: 07/19/2021 CBC and CMP are normal.  Uric acid is elevated at 7.4.    TB Gold: 02/14/2021 Neg   Left message to advise patient he is due to update labs.    Okay to refill Cosentyx?

## 2021-12-05 NOTE — Progress Notes (Unsigned)
Office Visit Note  Patient: Colin Jensen             Date of Birth: 05/29/1985           MRN: [id number]             PCP: Curlene Labrum, MD Referring: Curlene Labrum, MD Visit Date: 12/19/2021 Occupation: @GUAROCC @  Subjective:  Medication monitoring   History of Present Illness: Colin Jensen is a 36 y.o. male with history of psoriatic arthritis.  Patient remains on Cosentyx 300 mg sq injection every 28 days (started on 08/06/18).  He continues to tolerate Cosentyx without any side effects or injection site reactions.  He denies any recent flares.  He states that the Achilles tendinitis of his left foot has started to improve.  He has been using a roller on a daily basis as well as a TENS unit.  He still has some tightness along the plantar fascia of his left foot.  He denies any joint swelling at this time.  He states he has been more active at work which she has been enjoying.  He denies any difficulty with ADLs.  He has not had any nocturnal pain.  He denies any recent psoriasis flares.  He has not had any recent or recurrent infections.  He has not had to miss any doses of Cosentyx recently.  He denies any signs or symptoms of a gout flare.  He remains on allopurinol 300 mg 1 tablet daily.  He has not needed to take colchicine recently.    Activities of Daily Living:  Patient reports morning stiffness for a few minutes.   Patient Denies nocturnal pain.  Difficulty dressing/grooming: Denies Difficulty climbing stairs: Denies Difficulty getting out of chair: Denies Difficulty using hands for taps, buttons, cutlery, and/or writing: Denies  Review of Systems  Constitutional:  Negative for fatigue.  HENT:  Negative for mouth sores and mouth dryness.   Eyes:  Negative for dryness.  Respiratory:  Negative for shortness of breath.   Cardiovascular:  Negative for chest pain and palpitations.  Gastrointestinal:  Negative for blood in stool, constipation and diarrhea.  Endocrine:  Negative for increased urination.  Genitourinary:  Negative for involuntary urination.  Musculoskeletal:  Positive for morning stiffness. Negative for joint pain, gait problem, joint pain, joint swelling, myalgias, muscle weakness, muscle tenderness and myalgias.  Skin:  Negative for color change, rash and sensitivity to sunlight.  Allergic/Immunologic: Negative for susceptible to infections.  Neurological:  Negative for dizziness and headaches.  Hematological:  Negative for swollen glands.  Psychiatric/Behavioral:  Negative for depressed mood and sleep disturbance. The patient is not nervous/anxious.     PMFS History:  Patient Active Problem List   Diagnosis Date Noted   History of obesity 05/01/2016   Psoriatic arthritis ([place]) 12/06/2015   Psoriasis 12/06/2015   High risk medication use 12/06/2015    Past Medical History:  Diagnosis Date   Gout    Psoriatic arthritis ([place])     Family History  Problem Relation Age of Onset   Heart attack Father    Psoriasis Father    Diabetes Sister    Healthy Son    History reviewed. No pertinent surgical history. Social History   Social History Narrative   Lives with family   Immunization History  Administered Date(s) Administered   Influenza Inj Mdck Quad With Preservative 01/23/2018   PFIZER(Purple Top)SARS-COV-2 Vaccination 06/23/2019, 07/14/2019   Tdap 01/23/2018     Objective: Vital Signs: BP  130/89 (BP Location: Left Arm, Patient Position: Sitting, Cuff Size: Normal)   Pulse 69   Resp 17   Ht 5\' 11"  (1.803 m)   Wt 260 lb (117.9 kg)   BMI 36.26 kg/m    Physical Exam Vitals and nursing note reviewed.  Constitutional:      Appearance: He is well-developed.  HENT:     Head: Normocephalic and atraumatic.  Eyes:     Conjunctiva/sclera: Conjunctivae normal.     Pupils: Pupils are equal, round, and reactive to light.  Cardiovascular:     Rate and Rhythm: Normal rate and regular rhythm.     Heart sounds: Normal heart  sounds.  Pulmonary:     Effort: Pulmonary effort is normal.     Breath sounds: Normal breath sounds.  Abdominal:     General: Bowel sounds are normal.     Palpations: Abdomen is soft.  Musculoskeletal:     Cervical back: Normal range of motion and neck supple.  Skin:    General: Skin is warm and dry.     Capillary Refill: Capillary refill takes less than 2 seconds.     Comments: Fingernail pitting noted.  Neurological:     Mental Status: He is alert and oriented to person, place, and time.  Psychiatric:        Behavior: Behavior normal.      Musculoskeletal Exam: C-spine has good range of motion.  Some trapezius muscle tension tenderness bilaterally.  Shoulder joints, elbow joints, wrist joints, MCPs, PIPs, DIPs have good range of motion with no synovitis.  Complete fist formation bilaterally.  Hip joints have good range of motion with no groin pain.  Knee joints have good range of motion with no warmth or effusion.  Ankle joints have good range of motion with no tenderness or joint swelling.  CDAI Exam: CDAI Score: -- Patient Global: --; Provider Global: -- Swollen: --; Tender: -- Joint Exam 12/19/2021   No joint exam has been documented for this visit   There is currently no information documented on the homunculus. Go to the Rheumatology activity and complete the homunculus joint exam.  Investigation: No additional findings.  Imaging: No results found.  Recent Labs: Lab Results  Component Value Date   WBC 4.6 07/19/2021   HGB 15.8 07/19/2021   PLT 183 07/19/2021   NA 139 07/19/2021   K 4.1 07/19/2021   CL 100 07/19/2021   CO2 29 07/19/2021   GLUCOSE 80 07/19/2021   BUN 14 07/19/2021   CREATININE 0.84 07/19/2021   BILITOT 0.9 07/19/2021   ALKPHOS 46 05/07/2016   AST 24 07/19/2021   ALT 27 07/19/2021   PROT 7.2 07/19/2021   ALBUMIN 4.2 05/07/2016   CALCIUM 9.4 07/19/2021   GFRAA 146 04/21/2020   QFTBGOLDPLUS NEGATIVE 02/14/2021    Speciality Comments:  Cosentyx started on August 06, 2018 Inadequate response to Enbrel  Procedures:  No procedures performed Allergies: Nickel, Ibuprofen, and Penicillins     Assessment / Plan:     Visit Diagnoses: Psoriatic arthritis (HCC): He has no synovitis or dactylitis on examination.  He has not had any signs or symptoms of a psoriatic arthritis flare.  He has no active psoriasis at this time.  Fingernail pitting noted on examination.  His Achilles tendinitis and plantar fasciitis of the left foot have started to improve.  He has no SI joint tenderness upon palpation.  Overall he has clinically been doing well on Cosentyx 300 mg subcutaneous injections every 28 days.  He has been tolerating Cosentyx without any side effects or injection site reactions.  He has not had any recent or recurrent infections.  He will remain on Cosentyx as monotherapy.  He was advised to notify us if he develops signs or symptoms of a flare.  He will follow-up in the office in 5 months or sooner if needed.  Psoriasis: He has no active psoriasis at this time.  Fingernail pitting noted.  High risk medication use - Cosentyx 300 mg sq injection every 28 days (started on 08/06/18).  CBC and CMP updated on 07/19/21. Orders for CBC and CMP released today.  His next lab work will be due in February and every 3 months.  TB gold negative on 02/14/21. Future order for TB gold placed today.  He has not had any recent or recurrent infections.  Discussed the importance of holding cosentyx if he develops signs or symptoms of an infection and to resume once the infection has completely cleared.  He does not plan to get the annual flu shot. - Plan: CBC with Differential/Platelet, COMPLETE METABOLIC PANEL WITH GFR, QuantiFERON-TB Gold Plus  Screening for tuberculosis -Future order for TB Gold placed today.  Plan: QuantiFERON-TB Gold Plus  Chronic left shoulder pain - MRI of the left shoulder obtained on 10/05/2020.  MRI was consistent with labral tear.  He  continues to have intermittent discomfort and stiffness in the left shoulder joint.  He has good range of motion of the left shoulder joint on examination today.  Idiopathic chronic gout of right ankle without tophus -He has not had any signs or symptoms of a gout flare.  He has clinically been doing well taking allopurinol 300 mg 1 tablet daily for management of gout. uric acid: 07/19/2021 7.4-ideally his uric acid level should be less than 6.  Uric acid level be rechecked today.  He has not needed to take colchicine recently.  A refill of allopurinol was sent to the pharmacy.  He was advised to notify us if he develops signs or symptoms of a gout flare.- Plan: Uric acid, allopurinol (ZYLOPRIM) 300 MG tablet  Vitamin D deficiency: He is not currently taking a vitamin D supplement.   Trapezius muscle spasm: He has been having dry needling performed every 2 weeks which has been managing his symptoms.  Other medical conditions are listed as follows:  Neuralgia  Other fatigue  Family history of diabetes mellitus     Orders: Orders Placed This Encounter  Procedures   CBC with Differential/Platelet   COMPLETE METABOLIC PANEL WITH GFR   Uric acid   QuantiFERON-TB Gold Plus   Meds ordered this encounter  Medications   allopurinol (ZYLOPRIM) 300 MG tablet    Sig: Take 1 tablet (300 mg total) by mouth daily.    Dispense:  90 tablet    Refill:  0     Follow-Up Instructions: Return in about 5 months (around 05/20/2022) for Psoriatic arthritis.   Gearldine Bienenstock, PA-C  Note - This record has been created using Dragon software.  Chart creation errors have been sought, but may not always  have been located. Such creation errors do not reflect on  the standard of medical care.

## 2021-12-18 ENCOUNTER — Other Ambulatory Visit: Payer: Self-pay | Admitting: Physician Assistant

## 2021-12-18 DIAGNOSIS — L409 Psoriasis, unspecified: Secondary | ICD-10-CM

## 2021-12-18 DIAGNOSIS — L4050 Arthropathic psoriasis, unspecified: Secondary | ICD-10-CM

## 2021-12-19 ENCOUNTER — Ambulatory Visit: Payer: BC Managed Care – PPO | Attending: Physician Assistant | Admitting: Physician Assistant

## 2021-12-19 ENCOUNTER — Encounter: Payer: Self-pay | Admitting: Physician Assistant

## 2021-12-19 VITALS — BP 130/89 | HR 69 | Resp 17 | Ht 71.0 in | Wt 260.0 lb

## 2021-12-19 DIAGNOSIS — M25512 Pain in left shoulder: Secondary | ICD-10-CM | POA: Diagnosis not present

## 2021-12-19 DIAGNOSIS — Z111 Encounter for screening for respiratory tuberculosis: Secondary | ICD-10-CM

## 2021-12-19 DIAGNOSIS — M792 Neuralgia and neuritis, unspecified: Secondary | ICD-10-CM

## 2021-12-19 DIAGNOSIS — R5383 Other fatigue: Secondary | ICD-10-CM

## 2021-12-19 DIAGNOSIS — G8929 Other chronic pain: Secondary | ICD-10-CM

## 2021-12-19 DIAGNOSIS — L4050 Arthropathic psoriasis, unspecified: Secondary | ICD-10-CM | POA: Diagnosis not present

## 2021-12-19 DIAGNOSIS — Z79899 Other long term (current) drug therapy: Secondary | ICD-10-CM | POA: Diagnosis not present

## 2021-12-19 DIAGNOSIS — M25562 Pain in left knee: Secondary | ICD-10-CM

## 2021-12-19 DIAGNOSIS — M1A0710 Idiopathic chronic gout, right ankle and foot, without tophus (tophi): Secondary | ICD-10-CM

## 2021-12-19 DIAGNOSIS — M62838 Other muscle spasm: Secondary | ICD-10-CM

## 2021-12-19 DIAGNOSIS — L409 Psoriasis, unspecified: Secondary | ICD-10-CM | POA: Diagnosis not present

## 2021-12-19 DIAGNOSIS — Z833 Family history of diabetes mellitus: Secondary | ICD-10-CM

## 2021-12-19 DIAGNOSIS — E559 Vitamin D deficiency, unspecified: Secondary | ICD-10-CM

## 2021-12-19 LAB — COMPLETE METABOLIC PANEL WITH GFR
AG Ratio: 1.6 (calc) (ref 1.0–2.5)
ALT: 20 U/L (ref 9–46)
AST: 20 U/L (ref 10–40)
Albumin: 4.5 g/dL (ref 3.6–5.1)
Alkaline phosphatase (APISO): 59 U/L (ref 36–130)
BUN: 14 mg/dL (ref 7–25)
CO2: 32 mmol/L (ref 20–32)
Calcium: 9.3 mg/dL (ref 8.6–10.3)
Chloride: 101 mmol/L (ref 98–110)
Creat: 0.89 mg/dL (ref 0.60–1.26)
Globulin: 2.8 g/dL (calc) (ref 1.9–3.7)
Glucose, Bld: 71 mg/dL (ref 65–99)
Potassium: 4.4 mmol/L (ref 3.5–5.3)
Sodium: 139 mmol/L (ref 135–146)
Total Bilirubin: 0.7 mg/dL (ref 0.2–1.2)
Total Protein: 7.3 g/dL (ref 6.1–8.1)
eGFR: 112 mL/min/{1.73_m2} (ref >=60)

## 2021-12-19 LAB — CBC WITH DIFFERENTIAL/PLATELET
Absolute Monocytes: 557 cells/uL (ref 200–950)
Basophils Absolute: 81 cells/uL (ref 0–200)
Basophils Relative: 1.4 %
Eosinophils Absolute: 128 cells/uL (ref 15–500)
Eosinophils Relative: 2.2 %
HCT: 45.4 % (ref 38.5–50.0)
Hemoglobin: 15.7 g/dL (ref 13.2–17.1)
Lymphs Abs: 1717 cells/uL (ref 850–3900)
MCH: 31.2 pg (ref 27.0–33.0)
MCHC: 34.6 g/dL (ref 32.0–36.0)
MCV: 90.3 fL (ref 80.0–100.0)
MPV: 10.7 fL (ref 7.5–12.5)
Monocytes Relative: 9.6 %
Neutro Abs: 3318 cells/uL (ref 1500–7800)
Neutrophils Relative %: 57.2 %
Platelets: 214 10*3/uL (ref 140–400)
RBC: 5.03 10*6/uL (ref 4.20–5.80)
RDW: 12.5 % (ref 11.0–15.0)
Total Lymphocyte: 29.6 %
WBC: 5.8 10*3/uL (ref 3.8–10.8)

## 2021-12-19 LAB — URIC ACID: Uric Acid, Serum: 5.7 mg/dL (ref 4.0–8.0)

## 2021-12-19 MED ORDER — ALLOPURINOL 300 MG PO TABS: 300.0000 mg | ORAL_TABLET | Freq: Every day | ORAL | 0 refills | Status: DC

## 2021-12-19 NOTE — Progress Notes (Signed)
CBC WNL

## 2021-12-19 NOTE — Patient Instructions (Addendum)
Standing Labs We placed an order today for your standing lab work.   Please have your standing labs drawn in February and every 3 months   TB gold also due   Please have your labs drawn 2 weeks prior to your appointment so that the provider can discuss your lab results at your appointment.  Please note that you may see your imaging and lab results in MyChart before we have reviewed them. We will contact you once all results are reviewed. Please allow our office up to 72 hours to thoroughly review all of the results before contacting the office for clarification of your results.  Lab hours are:   Monday through Thursday from 8:00 am -12:30 pm and 1:00 pm-5:00 pm and Friday from 8:00 am-12:00 pm.  Please be advised, all patients with office appointments requiring lab work will take precedent over walk-in lab work.   Labs are drawn by [facility]. Please bring your co-pay at the time of your lab draw.  You may receive a bill from [facility] for your lab work.  Please note if you are on Hydroxychloroquine and and an order has been placed for a Hydroxychloroquine level, you will need to have it drawn 4 hours or more after your last dose.  If you wish to have your labs drawn at another location, please call the office 24 hours in advance so we can fax the orders.  The office is located at [address], [address], [place], [place] [address] No appointment is necessary.    If you have any questions regarding directions or hours of operation,  please call [phone].   As a reminder, please drink plenty of water prior to coming for your lab work. Thanks!

## 2021-12-20 NOTE — Progress Notes (Signed)
CBC and CMP are normal.  Uric acid is in the desirable range.

## 2022-01-08 ENCOUNTER — Other Ambulatory Visit: Payer: Self-pay | Admitting: Rheumatology

## 2022-01-08 ENCOUNTER — Other Ambulatory Visit: Payer: Self-pay | Admitting: Physician Assistant

## 2022-01-08 DIAGNOSIS — L409 Psoriasis, unspecified: Secondary | ICD-10-CM

## 2022-01-08 DIAGNOSIS — L4050 Arthropathic psoriasis, unspecified: Secondary | ICD-10-CM

## 2022-01-08 MED ORDER — COLCHICINE 0.6 MG PO TABS: 0.6000 mg | ORAL_TABLET | Freq: Every day | ORAL | 2 refills | Status: AC | PRN

## 2022-01-08 NOTE — Telephone Encounter (Signed)
Patient called the office stating the colchicine he takes PRN for gout flares is out of date by a year and would like a refill to have on hand. Patient would like it sent to [facility] in [place].

## 2022-01-08 NOTE — Telephone Encounter (Signed)
Next Visit: 05/20/2022  Last Visit: 12/19/2021  Last Fill: 11/12/2021  PP:[id number] arthritis   Current Dose per office note on 12/19/2021: Cosentyx 300 mg sq injection every 28 days   Labs: 12/19/2021  CBC and CMP are normal.  Uric acid is in the desirable range.   TB Gold: 02/14/2021 negative    Okay to refill cosentyx?

## 2022-01-08 NOTE — Telephone Encounter (Signed)
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   Labs: 12/19/2021 CBC and CMP are normal.  Uric acid is in the desirable range.   Okay to refill colchicine?

## 2022-01-10 ENCOUNTER — Telehealth: Payer: Self-pay | Admitting: Rheumatology

## 2022-01-10 NOTE — Telephone Encounter (Signed)
Please advise if this is okay. Thanks 

## 2022-01-10 NOTE — Telephone Encounter (Signed)
Patient called the office stating he recently picked up Colchicine from the pharmacy. Patient states he thought he was told he could take 2 daily if he was flaring. 1 tablet AM and 1 tablet PM. Patient wants to make sure that is correct so he doesn't take too much. Please advise.

## 2022-01-10 NOTE — Telephone Encounter (Signed)
He can take colchicine 0.6 mg 1 tablet BID PRN during flares.

## 2022-01-11 NOTE — Telephone Encounter (Signed)
Attempted to contact patient and unable to leave message, mailbox is full.

## 2022-01-11 NOTE — Telephone Encounter (Signed)
Advised patient that He can take colchicine 0.6 mg 1 tablet BID PRN during flares. Patient verbalized understanding.

## 2022-03-20 ENCOUNTER — Other Ambulatory Visit: Payer: Self-pay | Admitting: Physician Assistant

## 2022-03-20 DIAGNOSIS — L4050 Arthropathic psoriasis, unspecified: Secondary | ICD-10-CM

## 2022-03-20 DIAGNOSIS — L409 Psoriasis, unspecified: Secondary | ICD-10-CM

## 2022-03-20 NOTE — Telephone Encounter (Signed)
Next Visit: 05/20/2022  Last Visit: 12/19/2021  Last Fill: 01/08/2022  DX: Psoriatic arthritis    Current Dose per office note 12/19/2021: Cosentyx 300 mg sq injection every 28 days   Labs: 11/15/2023CBC and CMP are normal.  Uric acid is in the desirable range. I called patient and advised labs are due.  TB Gold:   02/14/2021 TB gold negative   Okay to refill Cosentyx?

## 2022-05-06 NOTE — Progress Notes (Signed)
Office Visit Note  Patient: Colin Jensen             Date of Birth: Jan 25, 1986           MRN: [id number]             PCP: Juliette Alcide, MD Referring: Juliette Alcide, MD Visit Date: 05/20/2022 Occupation: @GUAROCC @  Subjective:  Psoriasis   History of Present Illness: Sahib Pella is a 37 y.o. male with history of psoriatic arthritis and gout.  Patient is on cosentyx 300 mg sq injections every month. He is tolerating cosentyx without any side effects or injection site reactions.  He denies any recent or recurrent infections.  He states he has a large patch of psoriasis on his right upper thigh x1 month.   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  He states on Saturday he worked on 6 yards.  He is due for his next cosentyx dose on Sunday.  He has intermittent bouts of plantar fasciitis but no Achilles tendinitis.  He has occasional discomfort and stiffness in both SI joints.  He denies any joint swelling at this time.  He continues to see the chiropractor every 3 weeks and has dry needling performed every 3 weeks as well. He denies any signs or symptoms of a gout flare.  He remains on allopurinol 300 mg daily, and he takes colchicine as needed.  Activities of Daily Living:  Patient reports morning stiffness for less than 1 hour.   Patient Denies nocturnal pain.  Difficulty dressing/grooming: Denies Difficulty climbing stairs: Denies Difficulty getting out of chair: Denies Difficulty using hands for taps, buttons, cutlery, and/or writing: Denies  Review of Systems  Constitutional:  Positive for fatigue.  HENT:  Negative for mouth sores and mouth dryness.   Eyes:  Negative for dryness.  Respiratory:  Negative for shortness of breath.   Cardiovascular:  Negative for chest pain and palpitations.  Gastrointestinal:  Negative for blood in stool, constipation and diarrhea.  Endocrine: Negative for  increased urination.  Genitourinary:  Negative for involuntary urination.  Musculoskeletal:  Positive for morning stiffness. Negative for joint pain, gait problem, joint pain, joint swelling, myalgias, muscle weakness, muscle tenderness and myalgias.  Skin:  Positive for rash. Negative for color change and sensitivity to sunlight.  Allergic/Immunologic: Negative for susceptible to infections.  Neurological:  Positive for dizziness and headaches.  Hematological:  Negative for swollen glands.  Psychiatric/Behavioral:  Negative for depressed mood and sleep disturbance. The patient is not nervous/anxious.     PMFS History:  Patient Active Problem List   Diagnosis Date Noted   History of obesity 05/01/2016   Psoriatic arthritis 12/06/2015   Psoriasis 12/06/2015   High risk medication use 12/06/2015    Past Medical History:  Diagnosis Date   Gout    Psoriatic arthritis     Family History  Problem Relation Age of Onset   Heart attack Father    Psoriasis Father    Diabetes Sister    Healthy Son    History reviewed. No pertinent surgical history. Social History   Social History Narrative   Lives with family   Immunization History  Administered Date(s) Administered   Influenza Inj Mdck Quad With Preservative 01/23/2018   PFIZER(Purple Top)SARS-COV-2 Vaccination 06/23/2019, 07/14/2019   Tdap 01/23/2018     Objective: Vital Signs: BP 123/81 (BP Location: Left Arm, Patient Position: Sitting, Cuff Size: Normal)  Pulse 64   Resp 17   Ht  (1.803 m)   Wt 261 lb 3.2 oz (118.5 kg)   BMI 36.43 kg/m    Physical Exam Vitals and nursing note reviewed.  Constitutional:      Appearance: He is well-developed.  HENT:     Head: Normocephalic and atraumatic.  Eyes:     Conjunctiva/sclera: Conjunctivae normal.     Pupils: Pupils are equal, round, and reactive to light.  Cardiovascular:     Rate and Rhythm: Normal rate and regular rhythm.     Heart sounds: Normal heart sounds.   Pulmonary:     Effort: Pulmonary effort is normal.     Breath sounds: Normal breath sounds.  Abdominal:     General: Bowel sounds are normal.     Palpations: Abdomen is soft.  Musculoskeletal:     Cervical back: Normal range of motion and neck supple.  Skin:    General: Skin is warm and dry.     Capillary Refill: Capillary refill takes less than 2 seconds.     Comments: Large patch of psoriasis on the anterolateral aspect of the right upper thigh noted.  Neurological:     Mental Status: He is alert and oriented to person, place, and time.  Psychiatric:        Behavior: Behavior normal.      Musculoskeletal Exam: C-spine, thoracic spine, lumbar spine have good range of motion.  No midline spinal tenderness.  Some tenderness over both SI joints.  Shoulder joints, elbow joints, wrist joints, MCPs, PIPs, DIPs have good range of motion with no synovitis.  Complete fist formation bilaterally.  Hip joints have good range of motion with no groin pain.  Knee joints have good range of motion with no warmth or effusion.  Ankle joints have good range of motion with no tenderness or joint swelling.  No evidence of Achilles tendinitis or plantar fasciitis at this time.  CDAI Exam: CDAI Score: -- Patient Global: --; Provider Global: -- Swollen: --; Tender: -- Joint Exam 05/20/2022   No joint exam has been documented for this visit   There is currently no information documented on the homunculus. Go to the Rheumatology activity and complete the homunculus joint exam.  Investigation: No additional findings.  Imaging: No results found.  Recent Labs: Lab Results  Component Value Date   WBC 5.8 12/19/2021   HGB 15.7 12/19/2021   PLT 214 12/19/2021   NA 139 12/19/2021   K 4.4 12/19/2021   CL 101 12/19/2021   CO2 32 12/19/2021   GLUCOSE 71 12/19/2021   BUN 14 12/19/2021   CREATININE 0.89 12/19/2021   BILITOT 0.7 12/19/2021   ALKPHOS 46 05/07/2016   AST 20 12/19/2021   ALT 20  12/19/2021   PROT 7.3 12/19/2021   ALBUMIN 4.2 05/07/2016   CALCIUM 9.3 12/19/2021   GFRAA 146 04/21/2020   QFTBGOLDPLUS NEGATIVE 02/14/2021    Speciality Comments: Cosentyx started on August 06, 2018 Inadequate response to Enbrel  Procedures:  No procedures performed Allergies: Nickel, Ibuprofen, and Penicillins      Assessment / Plan:     Visit Diagnoses: Psoriatic arthritis: No synovitis or dactylitis was noted.  Patient is currently having a flare of psoriasis--large patch on the anterior lateral aspect of his right upper thigh was noted.  The patch started about 1 month ago and has slowly started to fade.  He remains on Cosentyx 300 mg subcutaneous injections every 21 days.  He has  not missed any doses of Cosentyx recently.  He has been unable to identify a trigger for the psoriasis flare.  A prescription for clobetasol cream was sent to the pharmacy today.  He was advised to notify us if he develops any other new patches or worsening of the current patch.  He has occasional bouts of plantar fasciitis which typically resolves with proper fitting shoes.  He has occasional discomfort in his SI joints and sees the chiropractor every 3 weeks and tries to perform stretching exercises daily.  He has been working part-time doing Aeronautical engineer on the weekends which causes some increased arthralgias and joint stiffness.  He has not had any joint swelling.  He will remain on Cosentyx as monotherapy.  He was advised to notify us if he develops any new or worsening symptoms.  He will follow-up in the office in 5 months or sooner if needed.  Psoriasis: He has a large patch of plaque psoriasis on the anterior lateral aspect of his right upper thigh.  A prescription for clobetasol 0.05% cream was sent to the pharmacy for which he can apply twice daily for up to 2 weeks.  He will remain on Cosentyx as prescribed.  He was advised to notify us if he develops any other new or worsening psoriasis patches.  High  risk medication use - - Cosentyx 300 mg sq injection every 28 days (started on 08/06/18). TB gold negative on 02/14/21.  Order for TB gold released today. CBC and CMP WNL on 12/19/21. Orders for CBC and CMP released today.  His next lab work will be due in July and every 3 months.  No recent or recurrent infections.  Discussed the importance of holding cosentyx if he develops signs or symptoms of an infection and to resume once the infection has completely cleared.   - Plan: CBC with Differential/Platelet, COMPLETE METABOLIC PANEL WITH GFR, QuantiFERON-TB Gold Plus  Screening-pulmonary TB -Order for TB gold released today.  Plan: QuantiFERON-TB Gold Plus  Chronic left shoulder pain - MRI of the left shoulder obtained on 10/05/2020.  MRI was consistent with labral tear.  Idiopathic chronic gout of right ankle without tophus -He has not had any signs or symptoms of a gout flare.  He is taking allopurinol 300 mg daily and colchicine 0.6 mg 1 tablet daily as needed during gout flares.  He is tolerating allopurinol without any side effects and has not missed any doses recently. Uric acid was WNL-5.7 on 12/19/21. Order for uric acid released today.   - Plan: Uric acid  Other medical conditions are listed as follows:  Vitamin D deficiency  Trapezius muscle spasm: He has continued dry needling every 3 weeks which has been helpful in alleviating his symptoms.  Neuralgia  Other fatigue  Family history of diabetes mellitus  Orders: Orders Placed This Encounter  Procedures   CBC with Differential/Platelet   COMPLETE METABOLIC PANEL WITH GFR   QuantiFERON-TB Gold Plus   Uric acid   Meds ordered this encounter  Medications   clobetasol cream (TEMOVATE) 0.05 %    Sig: Apply 1 Application topically 2 (two) times daily.    Dispense:  30 g    Refill:  1     Follow-Up Instructions: Return in about 5 months (around 10/20/2022) for Psoriatic arthritis.   Gearldine Bienenstock, PA-C  Note - This record  has been created using Dragon software.  Chart creation errors have been sought, but may not always  have been located. Such creation  errors do not reflect on  the standard of medical care.

## 2022-05-20 ENCOUNTER — Ambulatory Visit: Payer: BC Managed Care – PPO | Attending: Physician Assistant | Admitting: Physician Assistant

## 2022-05-20 ENCOUNTER — Encounter: Payer: Self-pay | Admitting: Physician Assistant

## 2022-05-20 VITALS — BP 123/81 | HR 64 | Resp 17 | Ht 71.0 in | Wt 261.2 lb

## 2022-05-20 DIAGNOSIS — M62838 Other muscle spasm: Secondary | ICD-10-CM

## 2022-05-20 DIAGNOSIS — Z79899 Other long term (current) drug therapy: Secondary | ICD-10-CM | POA: Diagnosis not present

## 2022-05-20 DIAGNOSIS — R5383 Other fatigue: Secondary | ICD-10-CM

## 2022-05-20 DIAGNOSIS — L4050 Arthropathic psoriasis, unspecified: Secondary | ICD-10-CM | POA: Diagnosis not present

## 2022-05-20 DIAGNOSIS — G8929 Other chronic pain: Secondary | ICD-10-CM

## 2022-05-20 DIAGNOSIS — Z833 Family history of diabetes mellitus: Secondary | ICD-10-CM

## 2022-05-20 DIAGNOSIS — M25512 Pain in left shoulder: Secondary | ICD-10-CM

## 2022-05-20 DIAGNOSIS — M792 Neuralgia and neuritis, unspecified: Secondary | ICD-10-CM

## 2022-05-20 DIAGNOSIS — L409 Psoriasis, unspecified: Secondary | ICD-10-CM | POA: Diagnosis not present

## 2022-05-20 DIAGNOSIS — E559 Vitamin D deficiency, unspecified: Secondary | ICD-10-CM

## 2022-05-20 DIAGNOSIS — Z111 Encounter for screening for respiratory tuberculosis: Secondary | ICD-10-CM

## 2022-05-20 DIAGNOSIS — M1A0710 Idiopathic chronic gout, right ankle and foot, without tophus (tophi): Secondary | ICD-10-CM

## 2022-05-20 MED ORDER — CLOBETASOL PROPIONATE 0.05 % EX CREA: 1.0000 | TOPICAL_CREAM | Freq: Two times a day (BID) | CUTANEOUS | 1 refills | Status: DC

## 2022-05-20 NOTE — Patient Instructions (Addendum)
Standing Labs We placed an order today for your standing lab work.   Please have your standing labs drawn in July and every 3 months   Please have your labs drawn 2 weeks prior to your appointment so that the provider can discuss your lab results at your appointment, if possible.  Please note that you may see your imaging and lab results in MyChart before we have reviewed them. We will contact you once all results are reviewed. Please allow our office up to 72 hours to thoroughly review all of the results before contacting the office for clarification of your results.  WALK-IN LAB HOURS  Monday through Thursday from 8:00 am -12:30 pm and 1:00 pm-5:00 pm and Friday from 8:00 am-12:00 pm.  Patients with office visits requiring labs will be seen before walk-in labs.  You may encounter longer than normal wait times. Please allow additional time. Wait times may be shorter on  Monday and Thursday afternoons.  We do not book appointments for walk-in labs. We appreciate your patience and understanding with our staff.   Labs are drawn by [facility]. Please bring your co-pay at the time of your lab draw.  You may receive a bill from [facility] for your lab work.  Please note if you are on Hydroxychloroquine and and an order has been placed for a Hydroxychloroquine level,  you will need to have it drawn 4 hours or more after your last dose.  If you wish to have your labs drawn at another location, please call the office 24 hours in advance so we can fax the orders.  The office is located at [address], [address], [place], [place] [address]   If you have any questions regarding directions or hours of operation,  please call [phone].   As a reminder, please drink plenty of water prior to coming for your lab work. Thanks!   Information for patients with Gout  Gout defined-Gout occurs when urate crystals accumulate in your joint causing the inflammation and intense pain of gout attack.  Urate  crystals can form when you have high levels of uric acid in your blood.  Your body produces uric acid when it breaks down prurines-substances that are found naturally in your body, as well as in certain foods such as organ meats, anchioves, herring, asparagus, and mushrooms.  Normally uric acid dissolves in your blood and passes through your kidneys into your urine.  But sometimes your body either produces too much uric acid or your kidneys excrete too little uric acid.  When this happens, uric acid can build up, forming sharp needle-like urate crystals in a joint or surrounding tissue that cause pain, inflammation and swelling.    Gout is characterized by sudden, severe attacks of pain, redness and tenderness in joints, often the joint at the base of the big toe.  Gout is complex form of arthritis that can affect anyone.  Men are more likely to get gout but women become increasingly more susceptible to gout after menopause.  An acute attack of gout can wake you up in the middle of the night with the sensation that your big toe is on fire.  The affected joint is hot, swollen and so tender that even the weight or the sheet on it may seem intolerable.  If you experience symptoms of an acute gout attack it is important to your doctor as soon as the symptoms start.  Gout that goes untreated can lead to worsening pain and joint damage.  Risk Factors:  You are more likely to develop gout if you have high levels of uric acid in your body.    Factors that increase the uric acid level in your body include:  Lifestyle factors.  Excessive alcohol use-generally more than two drinks a day for men and more than one for women increase the risk of gout.  Medical conditions.  Certain conditions make it more likely that you will develop gout.  These include hypertension, and chronic conditions such as diabetes, high levels of fat and cholesterol in the blood, and narrowing of the arteries.  Certain medications.   The uses of Thiazide diuretics- commonly used to treat hypertension and low dose aspirin can also increase uric acid levels.  Family history of gout.  If other members of your family have had gout, you are more likely to develop the disease.  Age and sex. Gout occurs more often in men than it does in women, primarily because women tend to have lower uric acid levels than men do.  Men are more likely to develop gout earlier usually between the ages of 31-50- whereas women generally develop signs and symptoms after menopause.    Tests and diagnosis:  Tests to help diagnose gout may include:  Blood test.  Your doctor may recommend a blood test to measure the uric acid level in your blood .  Blood tests can be misleading, though.  Some people have high uric acid levels but never experience gout.  And some people have signs and symptoms of gout, but don't have unusual levels of uric acid in their blood.  Joint fluid test.  Your doctor may use a needle to draw fluid from your affected joint.  When examined under the microscope, your joint fluid may reveal urate crystals.  Treatment:  Treatment for gout usually involves medications.  What medications you and your doctor choose will be based on your current health and other medications you currently take.  Gout medications can be used to treat acute gout attacks and prevent future attacks as well as reduce your risk of complications from gout such as the development of tophi from urate crystal deposits.  Alternative medicine:   Certain foods have been studied for their potential to lower uric acid levels, including:  Coffee.  Studies have found an association between coffee drinking (regular and decaf) and lower uric acid levels.  The evidence is not enough to encourage non-coffee drinkers to start, but it may give clues to new ways of treating gout in the future.  Vitamin C.  Supplements containing vitamin C may reduce the levels of uric acid in  your blood.  However, vitamin as a treatment for gout. Don't assume that if a little vitamin C is good, than lots is better.  Megadoses of vitamin C may increase your bodies uric acid levels.  Cherries.  Cherries have been associated with lower levels of uric acid in studies, but it isn't clear if they have any effect on gout signs and symptoms.  Eating more cherries and other dar-colored fruits, such as blackberries, blueberries, purple grapes and raspberries, may be a safe way to support your gout treatment.    Lifestyle/Diet Recommendations:  Drink 8 to 16 cups ( about 2 to 4 liters) of fluid each day, with at least half being water. Avoid alcohol Eat a moderate amount of protein, preferably from healthy sources, such as low-fat or fat-free dairy, tofu, eggs, and nut butters. Limit you daily intake of meat, fish,  and poultry to 4 to 6 ounces. Avoid high fat meats and desserts. Decrease you intake of shellfish, beef, lamb, pork, eggs and cheese. Choose a good source of vitamin C daily such as citrus fruits, strawberries, broccoli,  brussel sprouts, papaya, and cantaloupe.  Choose a good source of vitamin A every other day such as yellow fruits, or dark green/yellow vegetables. Avoid drastic weigh reduction or fasting.  If weigh loss is desired lose it over a period of several months. See "dietary considerations.." chart for specific food recommendations.  Dietary Considerations for people with Gout  Food with negligible purine content (0-15 mg of purine nitrogen per 100 grams food)  May use as desired except on calorie variations  Non fat milk Cocoa Cereals (except in list II) Hard candies  Buttermilk Carbonated drinks Vegetables (except in list II) Sherbet  Coffee Fruits Sugar Honey  Tea Cottage Cheese Gelatin-jell-o Salt  Fruit juice Breads Angel food Cake   Herbs/spices Jams/Jellies [employer]    Foods that do not contain excessive purine content, but must be limited  due to fat content  Cream Eggs Oil and Salad Dressing  Half and Half Peanut Butter Chocolate  Whole Milk Cakes Potato Chips  Butter Ice Cream Fried Foods  Cheese Nuts Waffles, pancakes   List II: Food with moderate purine content (50-150 mg of purine nitrogen per 100 grams of food)  Limit total amount each day to 5 oz. cooked Lean meat, other than those on list III   Poultry, other than those on list III Fish, other than those on list III   Seafood, other than those on list III  These foods may be used occasionally  Peas Lentils Bran  Spinach Oatmeal Dried Beans and Peas  Asparagus Wheat Germ Mushrooms   Additional information about meat choices  Choose fish and poultry, particularly without skin, often.  Select lean, well trimmed cuts of meat.  Avoid all fatty meats, bacon , sausage, fried meats, fried fish, or poultry, luncheon meats, cold cuts, hot dogs, meats canned or frozen in gravy, spareribs and frozen and packaged prepared meats.   List III: Foods with HIGH purine content / Foods to AVOID (150-800 mg of purine nitrogen per 100 grams of food)  Anchovies Herring Meat Broths  Liver Mackerel Meat Extracts  Kidney Scallops Meat Drippings  Sardines Wild Game Mincemeat  Sweetbreads Goose Gravy  Heart Tongue Yeast, baker's and brewers   Commercial soups made with any of the foods listed in List II or List III  In addition avoid all alcoholic beverages

## 2022-05-21 NOTE — Progress Notes (Signed)
CBC and CMP WNL.  Uric acid is 6.1--avoid purine rich diet and continue taking allopurinol as prescribed.

## 2022-05-22 LAB — CBC WITH DIFFERENTIAL/PLATELET
Absolute Monocytes: 426 cells/uL (ref 200–950)
Basophils Absolute: 59 cells/uL (ref 0–200)
Basophils Relative: 1.2 %
Eosinophils Absolute: 113 cells/uL (ref 15–500)
Eosinophils Relative: 2.3 %
HCT: 43.8 % (ref 38.5–50.0)
Hemoglobin: 14.8 g/dL (ref 13.2–17.1)
Lymphs Abs: 1857 cells/uL (ref 850–3900)
MCH: 31 pg (ref 27.0–33.0)
MCHC: 33.8 g/dL (ref 32.0–36.0)
MCV: 91.8 fL (ref 80.0–100.0)
MPV: 10.5 fL (ref 7.5–12.5)
Monocytes Relative: 8.7 %
Neutro Abs: 2445 cells/uL (ref 1500–7800)
Neutrophils Relative %: 49.9 %
Platelets: 204 10*3/uL (ref 140–400)
RBC: 4.77 10*6/uL (ref 4.20–5.80)
RDW: 13 % (ref 11.0–15.0)
Total Lymphocyte: 37.9 %
WBC: 4.9 10*3/uL (ref 3.8–10.8)

## 2022-05-22 LAB — QUANTIFERON-TB GOLD PLUS
Mitogen-NIL: 9.18 IU/mL
NIL: 0.02 IU/mL
QuantiFERON-TB Gold Plus: NEGATIVE
TB1-NIL: 0 IU/mL
TB2-NIL: 0 IU/mL

## 2022-05-22 LAB — COMPLETE METABOLIC PANEL WITH GFR
AG Ratio: 1.8 (calc) (ref 1.0–2.5)
ALT: 18 U/L (ref 9–46)
AST: 22 U/L (ref 10–40)
Albumin: 4.6 g/dL (ref 3.6–5.1)
Alkaline phosphatase (APISO): 53 U/L (ref 36–130)
BUN: 14 mg/dL (ref 7–25)
CO2: 27 mmol/L (ref 20–32)
Calcium: 9.3 mg/dL (ref 8.6–10.3)
Chloride: 103 mmol/L (ref 98–110)
Creat: 0.74 mg/dL (ref 0.60–1.26)
Globulin: 2.5 g/dL (calc) (ref 1.9–3.7)
Glucose, Bld: 95 mg/dL (ref 65–139)
Potassium: 4.5 mmol/L (ref 3.5–5.3)
Sodium: 140 mmol/L (ref 135–146)
Total Bilirubin: 0.8 mg/dL (ref 0.2–1.2)
Total Protein: 7.1 g/dL (ref 6.1–8.1)
eGFR: 119 mL/min/{1.73_m2} (ref >=60)

## 2022-05-22 LAB — URIC ACID: Uric Acid, Serum: 6.1 mg/dL (ref 4.0–8.0)

## 2022-05-22 NOTE — Progress Notes (Signed)
TB gold negative

## 2022-05-29 ENCOUNTER — Telehealth: Payer: Self-pay | Admitting: Pharmacist

## 2022-05-29 NOTE — Telephone Encounter (Signed)
Submitted a Prior Authorization renewal request to [facility] for COSENTYX Unoready pens via fax. Will update once we receive a response.  Fax: [phone] Phone: [phone] Case # [id number]  Chesley Mires, PharmD, MPH, BCPS, CPP Clinical Pharmacist (Rheumatology and Pulmonology

## 2022-05-29 NOTE — Telephone Encounter (Signed)
Received notification from [facility] regarding a prior authorization for COSENTYX. Authorization has been APPROVED from 05/29/22 to 05/29/23. Approval letter sent to scan center.  Patient must continue to fill through [facility]: [phone]  Authorization # [id number]  Chesley Mires, PharmD, MPH, BCPS, CPP Clinical Pharmacist (Rheumatology and Pulmonology)

## 2022-07-06 ENCOUNTER — Other Ambulatory Visit: Payer: Self-pay | Admitting: Physician Assistant

## 2022-07-06 DIAGNOSIS — M1A0710 Idiopathic chronic gout, right ankle and foot, without tophus (tophi): Secondary | ICD-10-CM

## 2022-07-08 NOTE — Telephone Encounter (Signed)
Last Fill: 12/19/2021  Labs: 05/20/2022 CBC and CMP WNL. 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

## 2022-07-22 ENCOUNTER — Other Ambulatory Visit: Payer: Self-pay | Admitting: Rheumatology

## 2022-07-22 DIAGNOSIS — L409 Psoriasis, unspecified: Secondary | ICD-10-CM

## 2022-07-22 DIAGNOSIS — L4050 Arthropathic psoriasis, unspecified: Secondary | ICD-10-CM

## 2022-07-22 NOTE — Telephone Encounter (Signed)
Last Fill: 03/20/2022  Labs: 05/20/2022 CBC and CMP WNL.   TB Gold: 05/20/2022 Neg    Next Visit: 10/21/2022  Last Visit: 05/20/2022  [id number] arthritis   Current Dose per office note 05/20/2022: Cosentyx 300 mg subcutaneous injections every 28 days   Okay to refill Cosentyx?

## 2022-08-15 ENCOUNTER — Other Ambulatory Visit: Payer: Self-pay | Admitting: Physician Assistant

## 2022-08-15 DIAGNOSIS — L409 Psoriasis, unspecified: Secondary | ICD-10-CM

## 2022-08-15 DIAGNOSIS — L4050 Arthropathic psoriasis, unspecified: Secondary | ICD-10-CM

## 2022-10-08 NOTE — Progress Notes (Signed)
Office Visit Note  Patient: Colin Jensen             Date of Birth: 09-Feb-1985           MRN: [id number]             PCP: Juliette Alcide, MD Referring: Juliette Alcide, MD Visit Date: 10/21/2022 Occupation: @GUAROCC @  Subjective:  Medication monitoring   History of Present Illness: Woodward Teply is a 37 y.o. male with history of psoriatic arthritis and gout.  Patient remains on Cosentyx 300 mg sq injection every 28 days (started on 08/06/18).  He is tolerating Cosentyx without any side effects or injection site reactions.  He has not missed any doses recently.  He denies any recent or recurrent infections.  He is planning on getting the annual flu shot.  He denies any new psoriasis patches.  He denies any Achilles tendinitis or plantar fasciitis but has been experiencing some pain at the peritoneal tendon insertion site on the lateral aspect of his left ankle.  Patient attributes the increased pain and wearing different shoes without support as well as walking on uneven terrain.  He denies any joint swelling.  Symptoms have gradually started to improve since wearing proper fitting shoes.  He denies any other increased joint pain or joint swelling at this time. He denies any signs or symptoms of a gout flare.  He has clinically been doing well taking allopurinol 300 mg daily and colchicine as needed during flares.   Activities of Daily Living:  Patient reports morning stiffness for 30 minutes.   Patient Denies nocturnal pain.  Difficulty dressing/grooming: Denies Difficulty climbing stairs: Denies Difficulty getting out of chair: Denies Difficulty using hands for taps, buttons, cutlery, and/or writing: Denies  Review of Systems  Constitutional:  Positive for fatigue.  HENT:  Negative for mouth sores and mouth dryness.   Eyes:  Negative for dryness.  Respiratory:  Negative for shortness of breath.   Cardiovascular:  Negative for chest pain and palpitations.  Gastrointestinal:   Negative for blood in stool, constipation and diarrhea.  Endocrine: Negative for increased urination.  Genitourinary:  Negative for involuntary urination.  Musculoskeletal:  Positive for joint pain, joint pain and morning stiffness. Negative for gait problem, joint swelling, myalgias, muscle weakness, muscle tenderness and myalgias.  Skin:  Negative for color change, rash and sensitivity to sunlight.  Allergic/Immunologic: Negative for susceptible to infections.  Neurological:  Negative for dizziness and headaches.  Hematological:  Negative for swollen glands.  Psychiatric/Behavioral:  Negative for depressed mood and sleep disturbance. The patient is not nervous/anxious.     PMFS History:  Patient Active Problem List   Diagnosis Date Noted   History of obesity 05/01/2016   Psoriatic arthritis (HCC) 12/06/2015   Psoriasis 12/06/2015   High risk medication use 12/06/2015    Past Medical History:  Diagnosis Date   Gout    Psoriatic arthritis (HCC)     Family History  Problem Relation Age of Onset   Heart attack Father    Psoriasis Father    Diabetes Sister    Healthy Son    History reviewed. No pertinent surgical history. Social History   Social History Narrative   Lives with family   Immunization History  Administered Date(s) Administered   Influenza Inj Mdck Quad With Preservative 01/23/2018   PFIZER(Purple Top)SARS-COV-2 Vaccination 06/23/2019, 07/14/2019   Tdap 01/23/2018     Objective: Vital Signs: BP 123/88 (BP Location: Left Arm, Patient Position: Sitting, Cuff  Size: Large)   Pulse 73   Resp 15   Ht 5\' 11"  (1.803 m)   Wt 262 lb 3.2 oz (118.9 kg)   BMI 36.57 kg/m    Physical Exam Vitals and nursing note reviewed.  Constitutional:      Appearance: He is well-developed.  HENT:     Head: Normocephalic and atraumatic.  Eyes:     Conjunctiva/sclera: Conjunctivae normal.     Pupils: Pupils are equal, round, and reactive to light.  Cardiovascular:     Rate  and Rhythm: Normal rate and regular rhythm.     Heart sounds: Normal heart sounds.  Pulmonary:     Effort: Pulmonary effort is normal.     Breath sounds: Normal breath sounds.  Abdominal:     General: Bowel sounds are normal.     Palpations: Abdomen is soft.  Musculoskeletal:     Cervical back: Normal range of motion and neck supple.  Skin:    General: Skin is warm and dry.     Capillary Refill: Capillary refill takes less than 2 seconds.     Comments: Fingernail pitting noted   Neurological:     Mental Status: He is alert and oriented to person, place, and time.  Psychiatric:        Behavior: Behavior normal.      Musculoskeletal Exam: C-spine, thoracic spine, and lumbar spine good ROM.  Shoulder joints, elbow joints, wrist joints, MCPs, PIPs, and DIPs good ROM with no synovitis.  Complete fist formation bilaterally.  Hip joints have good ROM with no groin pain.  Knee joints have good ROM with no warmth or effusion.  Ankle joints have good ROM with no tenderness or joint swelling.  No evidence of achilles tendonitis or plantar fascitis.  Tenderness over the base of the left peroneal tendon.  No tenderness or synovitis of MTP joints.    CDAI Exam: CDAI Score: -- Patient Global: --; Provider Global: -- Swollen: --; Tender: -- Joint Exam 10/21/2022   No joint exam has been documented for this visit   There is currently no information documented on the homunculus. Go to the Rheumatology activity and complete the homunculus joint exam.  Investigation: No additional findings.  Imaging: No results found.  Recent Labs: Lab Results  Component Value Date   WBC 4.9 05/20/2022   HGB 14.8 05/20/2022   PLT 204 05/20/2022   NA 140 05/20/2022   K 4.5 05/20/2022   CL 103 05/20/2022   CO2 27 05/20/2022   GLUCOSE 95 05/20/2022   BUN 14 05/20/2022   CREATININE 0.74 05/20/2022   BILITOT 0.8 05/20/2022   ALKPHOS 46 05/07/2016   AST 22 05/20/2022   ALT 18 05/20/2022   PROT 7.1  05/20/2022   ALBUMIN 4.2 05/07/2016   CALCIUM 9.3 05/20/2022   GFRAA 146 04/21/2020   QFTBGOLDPLUS NEGATIVE 05/20/2022    Speciality Comments: Cosentyx started on August 06, 2018 Inadequate response to Enbrel  Procedures:  No procedures performed Allergies: Nickel, Ibuprofen, and Penicillins    Assessment / Plan:     Visit Diagnoses: Psoriatic arthritis (HCC): No synovitis or dactylitis noted on examination today.  No SI joint tenderness upon palpation today.  He has not been experiencing any nocturnal pain.  No evidence of Achilles tendinitis or plantar fasciitis.  He has had some peritoneal tendinitis of the left foot.  Discussed the importance of wearing proper fitting shoes with ankle support as well as discussed the use of Voltaren gel which she can  apply topically for pain relief.  He was vies notify us if his symptoms persist or worsen.  He has not had any signs or symptoms of uveitis.  Overall he has clinically been doing well on Cosentyx 300 mg sq injections every 28 days.  He is tolerating Cosentyx without any side effects or injection site reactions.  He has not had any recent or recurrent infections.  No new medical conditions.  He was advised notify us if he develops signs or symptoms of a flare.  He will follow-up in the office in 5 months or sooner if needed.  Psoriasis: No active psoriasis at this time.  High risk medication use - Cosentyx 300 mg sq injection every 28 days (started on 08/06/18). CBC and CMP updated on 05/20/22. Orders for CBC and CMP released today.  His next lab work will be due in December and every 3 months to monitor for drug toxicity. TB gold negative on 05/20/22.  No recent or recurrent infections.  Discussed the importance of holding Cosentyx if you develop signs or symptoms of an infection and to resume once infection has completely cleared.  He is planning to receive the annual flu shot.  - Plan: COMPLETE METABOLIC PANEL WITH GFR, CBC with  Differential/Platelet  Chronic left shoulder pain - MRI of the left shoulder obtained on 10/05/2020.  MRI was consistent with labral tear.  Doing well.  Good ROM with no discomfort today.   Idiopathic chronic gout of right ankle without tophus -He has not had any signs or symptoms of a gout flare.  He has clinically been doing well taking allopurinol 300 mg daily and colchicine 0.6 mg 1 tablet daily as needed during gout flares.  Uric acid level was 6.1 on 05/20/2022.  Uric acid level be rechecked with upcoming lab work this week.  Future order placed today.- Plan: Uric acid  Vitamin D deficiency - Vitamin D level will be checked with upcoming lab work this week.  Plan: VITAMIN D 25 Hydroxy (Vit-D Deficiency, Fractures)  Other medical conditions are listed as follows:   Trapezius muscle spasm: Not currently experiencing muscle spasms.   Neuralgia  Other fatigue  Family history of diabetes mellitus  Orders: Orders Placed This Encounter  Procedures   COMPLETE METABOLIC PANEL WITH GFR   CBC with Differential/Platelet   Uric acid   VITAMIN D 25 Hydroxy (Vit-D Deficiency, Fractures)   No orders of the defined types were placed in this encounter.    Follow-Up Instructions: Return in about 5 months (around 03/23/2023) for Psoriatic arthritis, Gout.   Gearldine Bienenstock, PA-C  Note - This record has been created using Dragon software.  Chart creation errors have been sought, but may not always  have been located. Such creation errors do not reflect on  the standard of medical care.

## 2022-10-21 ENCOUNTER — Encounter: Payer: Self-pay | Admitting: Physician Assistant

## 2022-10-21 ENCOUNTER — Ambulatory Visit: Payer: BC Managed Care – PPO | Attending: Physician Assistant | Admitting: Physician Assistant

## 2022-10-21 VITALS — BP 123/88 | HR 73 | Resp 15 | Ht 71.0 in | Wt 262.2 lb

## 2022-10-21 DIAGNOSIS — L4050 Arthropathic psoriasis, unspecified: Secondary | ICD-10-CM | POA: Diagnosis not present

## 2022-10-21 DIAGNOSIS — R5383 Other fatigue: Secondary | ICD-10-CM

## 2022-10-21 DIAGNOSIS — M25512 Pain in left shoulder: Secondary | ICD-10-CM

## 2022-10-21 DIAGNOSIS — Z833 Family history of diabetes mellitus: Secondary | ICD-10-CM

## 2022-10-21 DIAGNOSIS — L409 Psoriasis, unspecified: Secondary | ICD-10-CM

## 2022-10-21 DIAGNOSIS — G8929 Other chronic pain: Secondary | ICD-10-CM

## 2022-10-21 DIAGNOSIS — M792 Neuralgia and neuritis, unspecified: Secondary | ICD-10-CM

## 2022-10-21 DIAGNOSIS — E559 Vitamin D deficiency, unspecified: Secondary | ICD-10-CM

## 2022-10-21 DIAGNOSIS — Z79899 Other long term (current) drug therapy: Secondary | ICD-10-CM | POA: Diagnosis not present

## 2022-10-21 DIAGNOSIS — M62838 Other muscle spasm: Secondary | ICD-10-CM

## 2022-10-21 DIAGNOSIS — M1A0710 Idiopathic chronic gout, right ankle and foot, without tophus (tophi): Secondary | ICD-10-CM

## 2022-10-21 NOTE — Patient Instructions (Signed)
Standing Labs We placed an order today for your standing lab work.   Please have your standing labs drawn in December and every 3 months   Please have your labs drawn 2 weeks prior to your appointment so that the provider can discuss your lab results at your appointment, if possible.  Please note that you may see your imaging and lab results in MyChart before we have reviewed them. We will contact you once all results are reviewed. Please allow our office up to 72 hours to thoroughly review all of the results before contacting the office for clarification of your results.  WALK-IN LAB HOURS  Monday through Thursday from 8:00 am -12:30 pm and 1:00 pm-5:00 pm and Friday from 8:00 am-12:00 pm.  Patients with office visits requiring labs will be seen before walk-in labs.  You may encounter longer than normal wait times. Please allow additional time. Wait times may be shorter on  Monday and Thursday afternoons.  We do not book appointments for walk-in labs. We appreciate your patience and understanding with our staff.   Labs are drawn by [facility]. Please bring your co-pay at the time of your lab draw.  You may receive a bill from [facility] for your lab work.  Please note if you are on Hydroxychloroquine and and an order has been placed for a Hydroxychloroquine level,  you will need to have it drawn 4 hours or more after your last dose.  If you wish to have your labs drawn at another location, please call the office 24 hours in advance so we can fax the orders.  The office is located at [address], [place], [place] [address]   If you have any questions regarding directions or hours of operation,  please call [phone].   As a reminder, please drink plenty of water prior to coming for your lab work. Thanks!

## 2022-11-06 ENCOUNTER — Other Ambulatory Visit: Payer: Self-pay | Admitting: Physician Assistant

## 2022-11-06 DIAGNOSIS — M1A0710 Idiopathic chronic gout, right ankle and foot, without tophus (tophi): Secondary | ICD-10-CM

## 2022-11-06 NOTE — Telephone Encounter (Signed)
Last Fill: 07/08/2022  Labs: 05/20/2022  CBC and CMP WNL. Uric acid is 6.1-  Next Visit: 04/10/2023  Last Visit: 10/21/2022  DX: Idiopathic chronic gout of right ankle without tophus   Current Dose per office note 10/21/2022: allopurinol 300 mg daily   Okay to refill Allopurinol?

## 2022-11-19 ENCOUNTER — Other Ambulatory Visit: Payer: Self-pay | Admitting: Physician Assistant

## 2022-11-19 DIAGNOSIS — L409 Psoriasis, unspecified: Secondary | ICD-10-CM

## 2022-11-19 DIAGNOSIS — L4050 Arthropathic psoriasis, unspecified: Secondary | ICD-10-CM

## 2022-11-27 ENCOUNTER — Other Ambulatory Visit: Payer: Self-pay | Admitting: *Deleted

## 2022-11-27 DIAGNOSIS — M1A0710 Idiopathic chronic gout, right ankle and foot, without tophus (tophi): Secondary | ICD-10-CM

## 2022-11-27 DIAGNOSIS — Z79899 Other long term (current) drug therapy: Secondary | ICD-10-CM

## 2022-11-27 DIAGNOSIS — L409 Psoriasis, unspecified: Secondary | ICD-10-CM

## 2022-11-27 DIAGNOSIS — L4050 Arthropathic psoriasis, unspecified: Secondary | ICD-10-CM

## 2022-11-27 DIAGNOSIS — E559 Vitamin D deficiency, unspecified: Secondary | ICD-10-CM

## 2022-11-27 MED ORDER — COSENTYX SENSOREADY (300 MG) 150 MG/ML ~~LOC~~ SOAJ: 300.0000 mg | SUBCUTANEOUS | 0 refills | Status: DC

## 2022-11-27 NOTE — Telephone Encounter (Signed)
Last Fill: 07/22/2022  Labs: 05/20/2022 CBC and CMP WNL. (Updated labs today, results pending)  TB Gold: 05/20/2022   Next Visit: 04/10/2023  Last Visit: 10/21/2022  DX: Psoriatic arthritis   Current Dose per office note 10/21/2022: Cosentyx 300 mg sq injection every 28 days   Okay to refill Cosentyx?

## 2022-11-28 LAB — COMPLETE METABOLIC PANEL WITH GFR
AG Ratio: 1.5 (calc) (ref 1.0–2.5)
ALT: 19 U/L (ref 9–46)
AST: 21 U/L (ref 10–40)
Albumin: 4.4 g/dL (ref 3.6–5.1)
Alkaline phosphatase (APISO): 62 U/L (ref 36–130)
BUN: 15 mg/dL (ref 7–25)
CO2: 29 mmol/L (ref 20–32)
Calcium: 9.5 mg/dL (ref 8.6–10.3)
Chloride: 101 mmol/L (ref 98–110)
Creat: 0.73 mg/dL (ref 0.60–1.26)
Globulin: 3 g/dL (ref 1.9–3.7)
Glucose, Bld: 107 mg/dL — ABNORMAL HIGH (ref 65–99)
Potassium: 4.2 mmol/L (ref 3.5–5.3)
Sodium: 138 mmol/L (ref 135–146)
Total Bilirubin: 0.6 mg/dL (ref 0.2–1.2)
Total Protein: 7.4 g/dL (ref 6.1–8.1)
eGFR: 119 mL/min/{1.73_m2} (ref >=60)

## 2022-11-28 LAB — CBC WITH DIFFERENTIAL/PLATELET
Absolute Lymphocytes: 1644 {cells}/uL (ref 850–3900)
Absolute Monocytes: 378 {cells}/uL (ref 200–950)
Basophils Absolute: 72 {cells}/uL (ref 0–200)
Basophils Relative: 1.2 %
Eosinophils Absolute: 120 {cells}/uL (ref 15–500)
Eosinophils Relative: 2 %
HCT: 43.2 % (ref 38.5–50.0)
Hemoglobin: 14.8 g/dL (ref 13.2–17.1)
MCH: 31.8 pg (ref 27.0–33.0)
MCHC: 34.3 g/dL (ref 32.0–36.0)
MCV: 92.7 fL (ref 80.0–100.0)
MPV: 10.4 fL (ref 7.5–12.5)
Monocytes Relative: 6.3 %
Neutro Abs: 3786 {cells}/uL (ref 1500–7800)
Neutrophils Relative %: 63.1 %
Platelets: 202 10*3/uL (ref 140–400)
RBC: 4.66 10*6/uL (ref 4.20–5.80)
RDW: 12.6 % (ref 11.0–15.0)
Total Lymphocyte: 27.4 %
WBC: 6 10*3/uL (ref 3.8–10.8)

## 2022-11-28 LAB — URIC ACID: Uric Acid, Serum: 4.8 mg/dL (ref 4.0–8.0)

## 2022-11-28 LAB — VITAMIN D 25 HYDROXY (VIT D DEFICIENCY, FRACTURES): Vit D, 25-Hydroxy: 39 ng/mL (ref 30–100)

## 2022-11-28 NOTE — Progress Notes (Signed)
Glucose is 107. Rest of CMP WNL.  CBC WNL Uric acid WNL Vitamin D WNL

## 2023-02-02 ENCOUNTER — Other Ambulatory Visit: Payer: Self-pay | Admitting: Physician Assistant

## 2023-02-02 DIAGNOSIS — M1A0710 Idiopathic chronic gout, right ankle and foot, without tophus (tophi): Secondary | ICD-10-CM

## 2023-02-03 NOTE — Telephone Encounter (Signed)
Last Fill: 11/07/2022  Labs: 11/27/2022 Glucose is 107. Rest of CMP WNL. CBC WNL Uric acid WNL  Next Visit: 04/10/2023  Last Visit: 10/21/2022  DX: Idiopathic chronic gout of right ankle without tophus   Current Dose per office note 10/21/2022:  allopurinol 300 mg daily   Okay to refill Allopurinol?

## 2023-02-04 ENCOUNTER — Other Ambulatory Visit: Payer: Self-pay | Admitting: Physician Assistant

## 2023-02-04 DIAGNOSIS — L409 Psoriasis, unspecified: Secondary | ICD-10-CM

## 2023-02-04 DIAGNOSIS — L4050 Arthropathic psoriasis, unspecified: Secondary | ICD-10-CM

## 2023-02-04 NOTE — Telephone Encounter (Signed)
 Last Fill: 11/27/2022  Labs: 11/27/2022 Glucose is 107. Rest of CMP WNL. CBC WNL  TB Gold: 05/20/2022 Neg    Next Visit: 04/10/2023  Last Visit: 10/21/2022  IK:Ednmpjupr arthritis   Current Dose per office note 10/21/2022: Cosentyx  300 mg sq injection every 28 days   Okay to refill Cosentyx ?

## 2023-03-27 NOTE — Progress Notes (Signed)
 Office Visit Note  Patient: Colin Jensen             Date of Birth: 1985/10/11           MRN: [id number]             PCP: Juliette Alcide, MD Referring: Juliette Alcide, MD Visit Date: 04/10/2023 Occupation: @GUAROCC @  Subjective:  Medication management  History of Present Illness: Colin Jensen is a 38 y.o. male with psoriatic arthritis, psoriasis, and gouty arthropathy.  He returns today after his last visit in September 2024.  He states he has not had psoriatic arthritis or psoriasis flare.  He has been taking Cosentyx 300 mg injections every 28 days without any interruption.  He states few weeks ago he developed some soreness in his left foot which she describes over the lateral aspect of his left foot.  He states the soreness lasted for 2 days and responded by taking colchicine.  He believes he had a gout flare due to stress.  He has been sticking to a low purine diet and has been exercising on a regular basis.  He has been taking allopurinol 300 mg daily on a regular basis.  He denies any flares of uveitis, plantar fasciitis or Achilles tendinitis.  He states last weekend he  injured his left middle finger and was seen by a chiropractor.  He states the x-ray was unremarkable at the time and he will have repeat x-ray.  He is using a splint on his left middle finger.  He continues to have some trapezius spasm for which she gets dry needling.    Activities of Daily Living:  Patient reports morning stiffness for 10-15 minutes.   Patient Denies nocturnal pain.  Difficulty dressing/grooming: Denies Difficulty climbing stairs: Denies Difficulty getting out of chair: Denies Difficulty using hands for taps, buttons, cutlery, and/or writing: Denies  Review of Systems  Constitutional:  Negative for fatigue.  HENT:  Positive for nose dryness. Negative for mouth sores and mouth dryness.   Eyes:  Negative for pain and dryness.  Respiratory:  Negative for shortness of breath and  difficulty breathing.   Cardiovascular:  Negative for chest pain and palpitations.  Gastrointestinal:  Negative for blood in stool, constipation and diarrhea.  Endocrine: Negative for increased urination.  Genitourinary:  Negative for involuntary urination.  Musculoskeletal:  Positive for joint pain, joint pain, joint swelling and morning stiffness. Negative for gait problem, myalgias, muscle weakness, muscle tenderness and myalgias.  Skin:  Negative for color change, rash and sensitivity to sunlight.  Allergic/Immunologic: Negative for susceptible to infections.  Neurological:  Negative for dizziness and headaches.  Hematological:  Negative for swollen glands.  Psychiatric/Behavioral:  Negative for depressed mood and sleep disturbance. The patient is not nervous/anxious.     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 No pertinent surgical history. Social History   Social History Narrative   Lives with family   Immunization History  Administered Date(s) Administered   Influenza Inj Mdck Quad With Preservative 01/23/2018   PFIZER(Purple Top)SARS-COV-2 Vaccination 06/23/2019, 07/14/2019   Tdap 01/23/2018     Objective: Vital Signs: BP 125/82 (BP Location: Left Arm, Patient Position: Sitting, Cuff Size: Large)   Pulse 81   Resp 16   Ht 5\' 11"  (1.803 m)   Wt 277 lb 12.8 oz (126 kg)   BMI 38.75 kg/m    Physical Exam Vitals and nursing note reviewed.  Constitutional:      Appearance: He is well-developed.  HENT:     Head:  Normocephalic and atraumatic.  Eyes:     Conjunctiva/sclera: Conjunctivae normal.     Pupils: Pupils are equal, round, and reactive to light.  Cardiovascular:     Rate and Rhythm: Normal rate and regular rhythm.     Heart sounds: Normal heart sounds.  Pulmonary:     Effort: Pulmonary effort is normal.     Breath sounds: Normal breath sounds.  Abdominal:     General: Bowel sounds are normal.     Palpations: Abdomen is soft.  Musculoskeletal:     Cervical back: Normal range of motion and neck supple.  Skin:    General: Skin is warm and dry.     Capillary Refill: Capillary refill takes less than 2 seconds.  Neurological:     Mental Status: He is alert and oriented to person, place, and time.  Psychiatric:        Behavior: Behavior normal.      Musculoskeletal Exam: Cervical, thoracic and lumbar spine 1 good range of motion.  He had no SI joint tenderness.  Shoulders, elbows, wrist joints, MCPs PIPs and DIPs with good range of motion.  He had bruising and some tenderness over his left middle PIP joint.  Hip joints and knee joints in good range of motion without any warmth swelling or effusion.  There was no tenderness over ankles or MTPs.  There was no plantar fasciitis or Achilles tendinitis.  CDAI Exam: CDAI Score: -- Patient Global: --; Provider Global: -- Swollen: --; Tender: -- Joint Exam 04/10/2023   No joint exam has been documented for this visit   There is currently no information documented on the homunculus. Go to the Rheumatology activity and complete the homunculus joint exam.  Investigation: No additional findings.  Imaging: No results found.  Recent Labs: Lab Results  Component Value Date   WBC 6.0 11/27/2022   HGB 14.8 11/27/2022   PLT 202 11/27/2022   NA 138 11/27/2022   K 4.2 11/27/2022   CL 101 11/27/2022   CO2 29 11/27/2022   GLUCOSE 107 (H) 11/27/2022   BUN 15 11/27/2022   CREATININE 0.73 11/27/2022   BILITOT 0.6 11/27/2022   ALKPHOS 46  05/07/2016   AST 21 11/27/2022   ALT 19 11/27/2022   PROT 7.4 11/27/2022   ALBUMIN 4.2 05/07/2016   CALCIUM 9.5 11/27/2022   GFRAA 146 04/21/2020   QFTBGOLDPLUS NEGATIVE 05/20/2022    Speciality Comments: Cosentyx started on August 06, 2018 Inadequate response to Enbrel  Procedures:  No procedures performed Allergies: Nickel, Ibuprofen, and Penicillins   Assessment / Plan:     Visit Diagnoses: Psoriatic arthritis (HCC)-patient had no synovitis on the examination.  He denies any history of Achilles tendinitis, plantar fasciitis, uveitis or dactylitis.  No synovitis was noted on the examination.  He is on Cosentyx 300 mg subcu every 28 days without any interruption.  Psoriasis-he had no active psoriasis.  High risk  medication use - Cosentyx 300 mg sq injection every 28 days (started on 08/06/18). - November 27, 2022 CBC and CMP were normal.  TB Gold was negative on May 20, 2022.Plan: CBC with Differential/Platelet, COMPLETE METABOLIC PANEL WITH GFR, QuantiFERON-TB Gold Plus.  Patient was advised to get labs every 3 months.  Information for immunization was placed in the AVS.  He was advised to hold Cosyntex if he develops an infection resume after the infection resolves.  Finger injury, left, subsequent encounter-he injured his left middle finger about a week ago.  He had bruising and swelling of the left third PIP joint.  Patient states he had x-rays by the chiropractor which were unremarkable.  He will have repeat x-rays.  He has been wearing a splint.  Chronic left shoulder pain -currently symptomatic.  He had good range of motion of the shoulder joint.  MRI of the left shoulder obtained on 10/05/2020.  MRI was consistent with labral tear.  Idiopathic chronic gout of right ankle without tophus -he has been on allopurinol 300 mg daily and colchicine 0.6 mg 1 tablet daily as needed during gout flares. uric acid: 4.8 on 11/27/2022 -patient believes he had a mild flare few weeks back due to  increased stress.  The symptoms lasted for couple of days and responded to colchicine.  Will check uric acid level today.  Plan: Uric acid  Vitamin D deficiency-he is currently not taking vitamin D.  Vitamin D was 39 on November 27, 2022.  Trapezius muscle spasm-he has dry needling.  Other fatigue-he cannot use to have fatigue which she relates to busy lifestyle.  Family history of diabetes mellitus  Orders: Orders Placed This Encounter  Procedures   CBC with Differential/Platelet   COMPLETE METABOLIC PANEL WITH GFR   Uric acid   QuantiFERON-TB Gold Plus   No orders of the defined types were placed in this encounter.    Follow-Up Instructions: Return in about 5 months (around 09/10/2023) for Psoriatic arthritis.   Pollyann Savoy, MD  Note - This record has been created using Animal nutritionist.  Chart creation errors have been sought, but may not always  have been located. Such creation errors do not reflect on  the standard of medical care.

## 2023-04-10 ENCOUNTER — Ambulatory Visit: Payer: BC Managed Care – PPO | Attending: Rheumatology | Admitting: Rheumatology

## 2023-04-10 ENCOUNTER — Encounter: Payer: Self-pay | Admitting: Rheumatology

## 2023-04-10 VITALS — BP 125/82 | HR 81 | Resp 16 | Ht 71.0 in | Wt 277.8 lb

## 2023-04-10 DIAGNOSIS — S6992XD Unspecified injury of left wrist, hand and finger(s), subsequent encounter: Secondary | ICD-10-CM

## 2023-04-10 DIAGNOSIS — Z833 Family history of diabetes mellitus: Secondary | ICD-10-CM

## 2023-04-10 DIAGNOSIS — L4050 Arthropathic psoriasis, unspecified: Secondary | ICD-10-CM

## 2023-04-10 DIAGNOSIS — M25512 Pain in left shoulder: Secondary | ICD-10-CM

## 2023-04-10 DIAGNOSIS — M62838 Other muscle spasm: Secondary | ICD-10-CM

## 2023-04-10 DIAGNOSIS — M1A0710 Idiopathic chronic gout, right ankle and foot, without tophus (tophi): Secondary | ICD-10-CM

## 2023-04-10 DIAGNOSIS — E559 Vitamin D deficiency, unspecified: Secondary | ICD-10-CM

## 2023-04-10 DIAGNOSIS — L409 Psoriasis, unspecified: Secondary | ICD-10-CM | POA: Diagnosis not present

## 2023-04-10 DIAGNOSIS — Z79899 Other long term (current) drug therapy: Secondary | ICD-10-CM | POA: Diagnosis not present

## 2023-04-10 DIAGNOSIS — M792 Neuralgia and neuritis, unspecified: Secondary | ICD-10-CM

## 2023-04-10 DIAGNOSIS — G8929 Other chronic pain: Secondary | ICD-10-CM

## 2023-04-10 DIAGNOSIS — R5383 Other fatigue: Secondary | ICD-10-CM

## 2023-04-10 NOTE — Patient Instructions (Addendum)
 Standing Labs We placed an order today for your standing lab work.   Please have your standing labs drawn in June  Please have your labs drawn 2 weeks prior to your appointment so that the provider can discuss your lab results at your appointment, if possible.  Please note that you may see your imaging and lab results in MyChart before we have reviewed them. We will contact you once all results are reviewed. Please allow our office up to 72 hours to thoroughly review all of the results before contacting the office for clarification of your results.  WALK-IN LAB HOURS  Monday through Thursday from 8:00 am -12:30 pm and 1:00 pm-5:00 pm and Friday from 8:00 am-12:00 pm.  Patients with office visits requiring labs will be seen before walk-in labs.  You may encounter longer than normal wait times. Please allow additional time. Wait times may be shorter on  Monday and Thursday afternoons.  We do not book appointments for walk-in labs. We appreciate your patience and understanding with our staff.   Labs are drawn by [facility]. Please bring your co-pay at the time of your lab draw.  You may receive a bill from [facility] for your lab work.  Please note if you are on Hydroxychloroquine and and an order has been placed for a Hydroxychloroquine level,  you will need to have it drawn 4 hours or more after your last dose.  If you wish to have your labs drawn at another location, please call the office 24 hours in advance so we can fax the orders.  The office is located at [address], [address], [place], [place] [address]   If you have any questions regarding directions or hours of operation,  please call [phone].   As a reminder, please drink plenty of water prior to coming for your lab work. Thanks!   Vaccines You are taking a medication(s) that can suppress your immune system.  The following immunizations are recommended: Flu annually Covid-19  RSV Td/Tdap (tetanus, diphtheria, pertussis)  every 10 years Pneumonia (Prevnar 15 then Pneumovax 23 at least 1 year apart.  Alternatively, can take Prevnar 20 without needing additional dose) Shingrix: 2 doses from 4 weeks to 6 months apart  Please check with your PCP to make sure you are up to date.   If you have signs or symptoms of an infection or start antibiotics: First, call your PCP for workup of your infection. Hold your medication through the infection, until you complete your antibiotics, and until symptoms resolve if you take the following: Injectable medication (Actemra, Benlysta, Cimzia, Cosentyx, Enbrel, Humira, Kevzara, Orencia, Remicade, Simponi, Stelara, Taltz, Tremfya) Methotrexate Leflunomide (Arava) Mycophenolate (Cellcept) Harriette Ohara, Olumiant, or Rinvoq Heart Disease Prevention   Your inflammatory disease increases your risk of heart disease which includes heart attack, stroke, atrial fibrillation (irregular heartbeats), high blood pressure, heart failure and atherosclerosis (plaque in the arteries).  It is important to reduce your risk by:   Keep blood pressure, cholesterol, and blood sugar at healthy levels   Smoking Cessation   Maintain a healthy weight  BMI 20-25   Eat a healthy diet  Plenty of fresh fruit, vegetables, and whole grains  Limit saturated fats, foods high in sodium, and added sugars  DASH and Mediterranean diet   Increase physical activity  Recommend moderate physically activity for 150 minutes per week/ 30 minutes a day for five days a week These can be broken up into three separate ten-minute sessions during the day.  Reduce Stress  Meditation, slow breathing exercises, yoga, coloring books  Dental visits twice a year

## 2023-04-11 NOTE — Progress Notes (Signed)
 Uric acid is in the desirable range.  CBC and CMP are normal.  TB Gold is pending.

## 2023-04-13 LAB — CBC WITH DIFFERENTIAL/PLATELET
Absolute Lymphocytes: 1530 {cells}/uL (ref 850–3900)
Absolute Monocytes: 398 {cells}/uL (ref 200–950)
Basophils Absolute: 61 {cells}/uL (ref 0–200)
Basophils Relative: 1.2 %
Eosinophils Absolute: 82 {cells}/uL (ref 15–500)
Eosinophils Relative: 1.6 %
HCT: 47.5 % (ref 38.5–50.0)
Hemoglobin: 16.1 g/dL (ref 13.2–17.1)
MCH: 31 pg (ref 27.0–33.0)
MCHC: 33.9 g/dL (ref 32.0–36.0)
MCV: 91.5 fL (ref 80.0–100.0)
MPV: 10.8 fL (ref 7.5–12.5)
Monocytes Relative: 7.8 %
Neutro Abs: 3029 {cells}/uL (ref 1500–7800)
Neutrophils Relative %: 59.4 %
Platelets: 214 10*3/uL (ref 140–400)
RBC: 5.19 10*6/uL (ref 4.20–5.80)
RDW: 12.4 % (ref 11.0–15.0)
Total Lymphocyte: 30 %
WBC: 5.1 10*3/uL (ref 3.8–10.8)

## 2023-04-13 LAB — COMPLETE METABOLIC PANEL WITH GFR
AG Ratio: 1.7 (calc) (ref 1.0–2.5)
ALT: 29 U/L (ref 9–46)
AST: 25 U/L (ref 10–40)
Albumin: 4.8 g/dL (ref 3.6–5.1)
Alkaline phosphatase (APISO): 62 U/L (ref 36–130)
BUN: 14 mg/dL (ref 7–25)
CO2: 32 mmol/L (ref 20–32)
Calcium: 10.1 mg/dL (ref 8.6–10.3)
Chloride: 101 mmol/L (ref 98–110)
Creat: 0.9 mg/dL (ref 0.60–1.26)
Globulin: 2.8 g/dL (ref 1.9–3.7)
Glucose, Bld: 83 mg/dL (ref 65–99)
Potassium: 4.8 mmol/L (ref 3.5–5.3)
Sodium: 139 mmol/L (ref 135–146)
Total Bilirubin: 0.6 mg/dL (ref 0.2–1.2)
Total Protein: 7.6 g/dL (ref 6.1–8.1)
eGFR: 111 mL/min/{1.73_m2} (ref >=60)

## 2023-04-13 LAB — URIC ACID: Uric Acid, Serum: 4.9 mg/dL (ref 4.0–8.0)

## 2023-04-13 LAB — QUANTIFERON-TB GOLD PLUS
Mitogen-NIL: 9.13 [IU]/mL
NIL: 0.02 [IU]/mL
QuantiFERON-TB Gold Plus: NEGATIVE
TB1-NIL: 0.01 [IU]/mL
TB2-NIL: 0.01 [IU]/mL

## 2023-04-14 NOTE — Progress Notes (Signed)
 TB Gold is negative.

## 2023-05-01 ENCOUNTER — Telehealth: Payer: Self-pay | Admitting: Pharmacist

## 2023-05-01 ENCOUNTER — Other Ambulatory Visit: Payer: Self-pay | Admitting: Physician Assistant

## 2023-05-01 DIAGNOSIS — L4050 Arthropathic psoriasis, unspecified: Secondary | ICD-10-CM

## 2023-05-01 DIAGNOSIS — L409 Psoriasis, unspecified: Secondary | ICD-10-CM

## 2023-05-01 DIAGNOSIS — Z79899 Other long term (current) drug therapy: Secondary | ICD-10-CM

## 2023-05-01 DIAGNOSIS — Z9225 Personal history of immunosupression therapy: Secondary | ICD-10-CM

## 2023-05-01 DIAGNOSIS — Z111 Encounter for screening for respiratory tuberculosis: Secondary | ICD-10-CM

## 2023-05-01 NOTE — Telephone Encounter (Signed)
 Last Fill: 02/04/2023  Labs: 04/10/2023 CBC and CMP are normal.   TB Gold: 04/10/2023 Neg    Next Visit: 09/10/2023  Last Visit: 04/10/2023  ZO:[id number] arthritis   Current Dose per office note 04/10/2023: Cosentyx 300 mg sq injection every 28 days   Okay to refill Cosentyx?

## 2023-05-01 NOTE — Telephone Encounter (Signed)
 Received notification from [facility] regarding a prior authorization for COSENTYX SQ. Authorization has been APPROVED from 05/01/2023 to 04/30/2024. Approval letter sent to scan center.  Case # [id number]

## 2023-05-01 NOTE — Telephone Encounter (Signed)
 Submitted a Prior Authorization RENEWAL request to [facility] for COSENTYX SQ via fax. Will update once we receive a response.  Fax: [phone] Phone: [phone] Case # [id number]

## 2023-05-15 ENCOUNTER — Other Ambulatory Visit: Payer: Self-pay | Admitting: *Deleted

## 2023-05-15 DIAGNOSIS — M1A0710 Idiopathic chronic gout, right ankle and foot, without tophus (tophi): Secondary | ICD-10-CM

## 2023-05-15 MED ORDER — ALLOPURINOL 300 MG PO TABS: 300.0000 mg | ORAL_TABLET | Freq: Every day | ORAL | 0 refills | Status: DC

## 2023-05-15 NOTE — Telephone Encounter (Signed)
 Received a message from [facility] in [place] requesting a refill on Allopurinol. Reached out to patient to ensure he is switching pharmacies. Patient confirmed he is switching to [facility].   Last Fill: 02/03/2023  Labs: 04/10/2023 Uric acid is in the desirable range.  CBC and CMP are normal.   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

## 2023-08-25 ENCOUNTER — Other Ambulatory Visit: Payer: Self-pay | Admitting: Physician Assistant

## 2023-08-25 DIAGNOSIS — M1A0710 Idiopathic chronic gout, right ankle and foot, without tophus (tophi): Secondary | ICD-10-CM

## 2023-08-25 NOTE — Telephone Encounter (Signed)
 Last Fill: 05/15/2023  Labs: 04/14/2023 Uric acid is in the desirable range.  CBC and CMP are normal.     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

## 2023-08-27 NOTE — Progress Notes (Unsigned)
 Office Visit Note  Patient: Colin Jensen             Date of Birth: 1985/12/01           MRN: [id number]             PCP: Lari Elspeth BRAVO, MD Referring: Lari Elspeth BRAVO, MD Visit Date: 09/10/2023 Occupation: @GUAROCC @  Subjective:  Medication monitoring  History of Present Illness: Jairen Goldfarb is a 38 y.o. male with history of psoriatic arthritis and gout.  Patient remains on Cosentyx  300 mg sq injection every 28 days.  He is tolerating Cosentyx  without any side effects.  No recent or recurrent infections.  Patient has had a recurrence of left ankle tendinitis.  He has been performing ankle strengthening exercises.  Patient states that he experiences some stiffness leading up to his injection but overall his symptoms have been well-controlled on the current treatment regimen.  He denies any joint swelling at this time.  He denies any Achilles tendinitis or plantar fasciitis.  He denies any SI joint pain. He denies any new medical conditions.  He has been going to the gym on a regular basis for the past 1 month.  He is also been working with a nutritionist.    Activities of Daily Living:  Patient reports morning stiffness for 0 minute.   Patient Denies nocturnal pain.  Difficulty dressing/grooming: Denies Difficulty climbing stairs: Denies Difficulty getting out of chair: Denies Difficulty using hands for taps, buttons, cutlery, and/or writing: Denies  Review of Systems  Constitutional:  Positive for fatigue.  HENT:  Negative for mouth sores and mouth dryness.   Eyes:  Negative for dryness.  Respiratory:  Negative for shortness of breath.   Cardiovascular:  Negative for chest pain and palpitations.  Gastrointestinal:  Negative for blood in stool, constipation and diarrhea.  Endocrine: Negative for increased urination.  Genitourinary:  Negative for involuntary urination.  Musculoskeletal:  Positive for joint pain and joint pain. Negative for gait problem, joint swelling,  myalgias, muscle weakness, morning stiffness, muscle tenderness and myalgias.  Skin:  Negative for color change, rash, hair loss and sensitivity to sunlight.  Allergic/Immunologic: Negative for susceptible to infections.  Neurological:  Positive for dizziness and headaches.  Hematological:  Negative for swollen glands.  Psychiatric/Behavioral:  Negative for depressed mood and sleep disturbance. The patient is not nervous/anxious.     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 No pertinent surgical history. Social History   Social History Narrative   Lives with family   Immunization History  Administered Date(s) Administered   Influenza Inj Mdck Quad With Preservative 01/23/2018   PFIZER(Purple Top)SARS-COV-2 Vaccination 06/23/2019, 07/14/2019   Tdap 01/23/2018     Objective: Vital Signs: BP 118/81 (BP Location: Left Arm, Patient Position: Sitting, Cuff Size: Large)   Pulse 65   Resp 14   Ht 5' 11 (1.803 m)   Wt 273 lb (123.8 kg)   BMI 38.08 kg/m  Physical Exam Vitals and nursing note reviewed.  Constitutional:      Appearance: He is well-developed.  HENT:     Head: Normocephalic and atraumatic.  Eyes:     Conjunctiva/sclera: Conjunctivae normal.     Pupils: Pupils are equal, round, and reactive to light.  Cardiovascular:     Rate and Rhythm: Normal rate and regular rhythm.     Heart sounds: Normal heart sounds.  Pulmonary:     Effort: Pulmonary effort is normal.     Breath sounds: Normal breath sounds.   Abdominal:     General: Bowel sounds are normal.     Palpations: Abdomen is soft.  Musculoskeletal:     Cervical back: Normal range of motion and neck supple.  Skin:    General: Skin is warm and dry.     Capillary Refill: Capillary refill takes less than 2 seconds.  Neurological:     Mental Status: He is alert and oriented to person, place, and time.  Psychiatric:        Behavior: Behavior normal.      Musculoskeletal Exam: C-spine, thoracic spine, lumbar spine good range of motion.  No midline spinal tenderness.  No SI joint tenderness.  Shoulder joints, elbow joints, wrist joints, MCPs, PIPs, DIPs have good range of motion with no synovitis.  Some thickening of the left 2nd and 3rd PIP joints.  Hip joints have good range of motion with no groin pain.  Knee joints have good range of motion no warmth or effusion.  Ankle joints have good range of motion.  No tenderness on the Achilles tendon.  No plantar fasciitis noted.  Tenderness over the peroneal insertion site of the left ankle noted.  CDAI Exam: CDAI Score: -- Patient Global: --; Provider Global: -- Swollen: --; Tender: -- Joint Exam 09/10/2023   No joint exam has been documented for this visit   There is currently no information documented on the homunculus. Go to the Rheumatology activity and complete the homunculus joint exam.  Investigation: No additional findings.  Imaging: No results found.  Recent Labs: Lab Results  Component Value Date   WBC 5.1 04/10/2023   HGB 16.1 04/10/2023   PLT 214 04/10/2023   NA 139 04/10/2023   K 4.8 04/10/2023   CL 101 04/10/2023   CO2 32 04/10/2023   GLUCOSE 83 04/10/2023   BUN 14 04/10/2023   CREATININE 0.90 04/10/2023   BILITOT 0.6 04/10/2023   ALKPHOS 46 05/07/2016   AST 25 04/10/2023   ALT 29 04/10/2023   PROT 7.6 04/10/2023   ALBUMIN 4.2 05/07/2016   CALCIUM 10.1 04/10/2023   GFRAA 146 04/21/2020   QFTBGOLDPLUS NEGATIVE 04/10/2023    Speciality Comments: Cosentyx   started on August 06, 2018 Inadequate response to Enbrel   Procedures:  No procedures performed Allergies: Nickel, Ibuprofen, and Penicillins   Assessment / Plan:     Visit Diagnoses: Psoriatic arthritis (HCC): No synovitis or dactylitis noted on examination today.  No SI joint tenderness upon palpation.  No evidence of Achilles tendinitis or plantar fasciitis.  He is experiencing symptoms consistent with peroneal tendinitis of the left ankle.  He has been working on left ankle strengthening exercises as well as wearing a compression sleeve while exercising and a more supportive shoe throughout the day.  Discussed that if his symptoms persist or worsen he should follow-up at [facility] for further evaluation and management. He has been exercising on a regular basis as well as working with nutritionist which has  been helpful. Overall he is clinically been doing well on Cosentyx  300 mg sq injections once every 28 days.  He is tolerating Cosentyx  without any side effects or injection site reactions.  He has not had any recent or recurrent infections.  He experiences some stiffness a few days prior to his Cosentyx  dose but is otherwise not had any major breakthrough symptoms or signs of active disease.  No medication changes will be made at this time.  He was advised to notify us  if he develops any signs or symptoms of a flare.  He will follow-up in the office in 5 months or sooner if needed.  Psoriasis: He has a few small patches of psoriasis on the extensor surface of his right elbow.  Fingernail pitting noted.  Overall his symptoms have been well-controlled on Cosentyx .  He has a prescription for clobetasol  cream which she can apply topically as needed.  He was advised to notify us  if he develops any new or worsening symptoms.  High risk medication use - Cosentyx  300 mg sq injection every 28 days (started on 08/06/18).  CBC and CMP updated on 04/10/23. Orders for CBC and CMP released today.  TB gold  negative on 04/10/23.   Discussed the importance of holding cosentyx  if he develops signs or symptoms of an infection and to resume once the infection has completely cleared. - Plan: CBC with Differential/Platelet, Comprehensive metabolic panel with GFR  Chronic left shoulder pain - MRI of the left shoulder obtained on 10/05/2020.  MRI was consistent with labral tear.  Good ROM with no discomfort currently.    Idiopathic chronic gout of right ankle without tophus - He has not had any signs or symptoms of a gout flare.  He has clinically been doing well taking allopurinol  300 mg daily and colchicine  0.6 mg 1 tablet daily as needed during gout flares. Uric acid WNL-4.9 on 04/10/23.   Plan to check uric acid level today.  No medication changes will be made at this time.- Plan: Uric acid  Other medical conditions are listed as follows:   Vitamin D  deficiency  Trapezius muscle spasm: Not currently symptomatic.    Other fatigue: Stable.  He has been exercising on a regular basis for the past 1 month.    Family history of diabetes mellitus  Neuralgia  Orders: Orders Placed This Encounter  Procedures   CBC with Differential/Platelet   Comprehensive metabolic panel with GFR   Uric acid   No orders of the defined types were placed in this encounter.   Follow-Up Instructions: Return in about 5 months (around 02/10/2024) for Psoriatic arthritis.   Waddell CHRISTELLA Craze, PA-C  Note - This record has been created using Dragon software.  Chart creation errors have been sought, but may not always  have been located. Such creation errors do not reflect on  the standard of medical care.

## 2023-09-10 ENCOUNTER — Ambulatory Visit: Attending: Physician Assistant | Admitting: Physician Assistant

## 2023-09-10 ENCOUNTER — Encounter: Payer: Self-pay | Admitting: Physician Assistant

## 2023-09-10 VITALS — BP 118/81 | HR 65 | Resp 14 | Ht 71.0 in | Wt 273.0 lb

## 2023-09-10 DIAGNOSIS — G8929 Other chronic pain: Secondary | ICD-10-CM

## 2023-09-10 DIAGNOSIS — E559 Vitamin D deficiency, unspecified: Secondary | ICD-10-CM

## 2023-09-10 DIAGNOSIS — Z79899 Other long term (current) drug therapy: Secondary | ICD-10-CM | POA: Diagnosis not present

## 2023-09-10 DIAGNOSIS — L409 Psoriasis, unspecified: Secondary | ICD-10-CM

## 2023-09-10 DIAGNOSIS — Z833 Family history of diabetes mellitus: Secondary | ICD-10-CM

## 2023-09-10 DIAGNOSIS — L4050 Arthropathic psoriasis, unspecified: Secondary | ICD-10-CM | POA: Diagnosis not present

## 2023-09-10 DIAGNOSIS — S6992XD Unspecified injury of left wrist, hand and finger(s), subsequent encounter: Secondary | ICD-10-CM

## 2023-09-10 DIAGNOSIS — M25512 Pain in left shoulder: Secondary | ICD-10-CM

## 2023-09-10 DIAGNOSIS — M1A0710 Idiopathic chronic gout, right ankle and foot, without tophus (tophi): Secondary | ICD-10-CM

## 2023-09-10 DIAGNOSIS — M792 Neuralgia and neuritis, unspecified: Secondary | ICD-10-CM

## 2023-09-10 DIAGNOSIS — M62838 Other muscle spasm: Secondary | ICD-10-CM

## 2023-09-10 DIAGNOSIS — R5383 Other fatigue: Secondary | ICD-10-CM

## 2023-09-10 NOTE — Patient Instructions (Signed)
 Standing Labs We placed an order today for your standing lab work.   Please have your standing labs drawn in November and every 3 months   Please have your labs drawn 2 weeks prior to your appointment so that the provider can discuss your lab results at your appointment, if possible.  Please note that you may see your imaging and lab results in MyChart before we have reviewed them. We will contact you once all results are reviewed. Please allow our office up to 72 hours to thoroughly review all of the results before contacting the office for clarification of your results.  WALK-IN LAB HOURS  Monday through Thursday from 8:00 am -12:30 pm and 1:00 pm-4:30 pm and Friday from 8:00 am-12:00 pm.  Patients with office visits requiring labs will be seen before walk-in labs.  You may encounter longer than normal wait times. Please allow additional time. Wait times may be shorter on  Monday and Thursday afternoons.  We do not book appointments for walk-in labs. We appreciate your patience and understanding with our staff.   Labs are drawn by [facility]. Please bring your co-pay at the time of your lab draw.  You may receive a bill from [facility] for your lab work.  Please note if you are on Hydroxychloroquine and and an order has been placed for a Hydroxychloroquine level,  you will need to have it drawn 4 hours or more after your last dose.  If you wish to have your labs drawn at another location, please call the office 24 hours in advance so we can fax the orders.  The office is located at [address], [address], [place], [place] [address]   If you have any questions regarding directions or hours of operation,  please call [phone].   As a reminder, please drink plenty of water  prior to coming for your lab work. Thanks!

## 2023-09-11 ENCOUNTER — Ambulatory Visit: Payer: Self-pay | Admitting: Physician Assistant

## 2023-09-11 LAB — CBC WITH DIFFERENTIAL/PLATELET
Absolute Lymphocytes: 1910 {cells}/uL (ref 850–3900)
Absolute Monocytes: 482 {cells}/uL (ref 200–950)
Basophils Absolute: 39 {cells}/uL (ref 0–200)
Basophils Relative: 0.7 %
Eosinophils Absolute: 123 {cells}/uL (ref 15–500)
Eosinophils Relative: 2.2 %
HCT: 44.9 % (ref 38.5–50.0)
Hemoglobin: 15.2 g/dL (ref 13.2–17.1)
MCH: 31.5 pg (ref 27.0–33.0)
MCHC: 33.9 g/dL (ref 32.0–36.0)
MCV: 93.2 fL (ref 80.0–100.0)
MPV: 10.5 fL (ref 7.5–12.5)
Monocytes Relative: 8.6 %
Neutro Abs: 3046 {cells}/uL (ref 1500–7800)
Neutrophils Relative %: 54.4 %
Platelets: 182 Thousand/uL (ref 140–400)
RBC: 4.82 Million/uL (ref 4.20–5.80)
RDW: 12.5 % (ref 11.0–15.0)
Total Lymphocyte: 34.1 %
WBC: 5.6 Thousand/uL (ref 3.8–10.8)

## 2023-09-11 LAB — COMPREHENSIVE METABOLIC PANEL WITH GFR
AG Ratio: 1.6 (calc) (ref 1.0–2.5)
ALT: 21 U/L (ref 9–46)
AST: 20 U/L (ref 10–40)
Albumin: 4.5 g/dL (ref 3.6–5.1)
Alkaline phosphatase (APISO): 65 U/L (ref 36–130)
BUN: 12 mg/dL (ref 7–25)
CO2: 32 mmol/L (ref 20–32)
Calcium: 9.1 mg/dL (ref 8.6–10.3)
Chloride: 101 mmol/L (ref 98–110)
Creat: 0.91 mg/dL (ref 0.60–1.29)
Globulin: 2.8 g/dL (ref 1.9–3.7)
Glucose, Bld: 88 mg/dL (ref 65–99)
Potassium: 4.6 mmol/L (ref 3.5–5.3)
Sodium: 138 mmol/L (ref 135–146)
Total Bilirubin: 0.5 mg/dL (ref 0.2–1.2)
Total Protein: 7.3 g/dL (ref 6.1–8.1)
eGFR: 109 mL/min/1.73m2 (ref >=60)

## 2023-09-11 LAB — URIC ACID: Uric Acid, Serum: 5.4 mg/dL (ref 4.0–8.0)

## 2023-09-11 NOTE — Progress Notes (Signed)
CBC and CMP WNL.  Uric acid WNL.

## 2023-09-18 ENCOUNTER — Other Ambulatory Visit: Payer: Self-pay | Admitting: Rheumatology

## 2023-09-18 DIAGNOSIS — L4050 Arthropathic psoriasis, unspecified: Secondary | ICD-10-CM

## 2023-09-18 DIAGNOSIS — L409 Psoriasis, unspecified: Secondary | ICD-10-CM

## 2023-09-18 NOTE — Telephone Encounter (Signed)
 Last Fill: 05/01/2023  Labs: 09/10/2023 CBC and CMP WNL   TB Gold: 04/10/2023 negative    Next Visit: 02/16/2024  Last Visit: 09/11/2023  IK:Ednmpjupr arthritis   Current Dose per office note on 09/11/2023: Cosentyx  300 mg sq injection every 28 days   Okay to refill Cosentyx ?

## 2023-12-03 ENCOUNTER — Other Ambulatory Visit: Payer: Self-pay | Admitting: Physician Assistant

## 2023-12-03 DIAGNOSIS — M1A0710 Idiopathic chronic gout, right ankle and foot, without tophus (tophi): Secondary | ICD-10-CM

## 2023-12-04 NOTE — Telephone Encounter (Signed)
 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

## 2023-12-29 ENCOUNTER — Other Ambulatory Visit: Payer: Self-pay | Admitting: Physician Assistant

## 2023-12-29 DIAGNOSIS — L409 Psoriasis, unspecified: Secondary | ICD-10-CM

## 2023-12-29 DIAGNOSIS — L4050 Arthropathic psoriasis, unspecified: Secondary | ICD-10-CM

## 2023-12-29 NOTE — Telephone Encounter (Signed)
 Last Fill: 09/18/2023  Labs: 09/10/2023 CBC and CMP WNL  Uric acid WNL  TB Gold: 04/10/2023 Neg    Next Visit: 02/16/2024  Last Visit: 09/10/2023  IK:Ednmpjupr arthritis   Current Dose per office note 09/10/2023: Cosentyx  300 mg sq injections once every 28 days.   Okay to refill Cosentyx ?

## 2024-02-02 NOTE — Progress Notes (Unsigned)
 "  Office Visit Note  Patient: Colin Jensen             Date of Birth: Apr 04, 1985           MRN: [id number]             PCP: Lari Elspeth BRAVO, MD Referring: Lari Elspeth BRAVO, MD Visit Date: 02/16/2024 Occupation: Data Unavailable  Subjective:  Medication monitoring   History of Present Illness: Colin Jensen is a 38 y.o. male with history of psoriatic arthritis.  Patient is currently on Cosentyx  300 mg sq injections every 28 days. He is tolerating Cosentyx  without any side effects or injection site reactions.  Patient states that he had to postpone his most recent dose of Cosentyx  by 2 weeks due to a recent infection.  Patient states that he administered Cosentyx  yesterday.  He denies any joint swelling at this time.  He has occasional SI joint pain but overall symptoms have been manageable.  He denies any Achilles tendinitis or plantar fasciitis.  He has some psoriasis in the gluteal and umbilical region.  He requested a refill of clobetasol  cream to be sent to the pharmacy today. He denies any signs or symptoms of a gout flare.  He remains on allopurinol  300 mg daily. Patient states that he will be undergoing a sleep study.  He is also considering starting a GLP-1 as discussed with his PCP.  He has been going to the gym on a regular basis and has been making dietary changes.     Activities of Daily Living:  Patient reports morning stiffness for 5-10 minutes.   Patient Denies nocturnal pain.  Difficulty dressing/grooming: Denies Difficulty climbing stairs: Denies Difficulty getting out of chair: Denies Difficulty using hands for taps, buttons, cutlery, and/or writing: Denies  Review of Systems  Constitutional:  Positive for fatigue.  HENT:  Negative for mouth sores and mouth dryness.   Eyes:  Negative for dryness.  Respiratory:  Negative for shortness of breath.   Cardiovascular:  Negative for chest pain and palpitations.  Gastrointestinal:  Negative for blood in stool,  constipation and diarrhea.  Endocrine: Negative for increased urination.  Genitourinary:  Negative for involuntary urination.  Musculoskeletal:  Positive for morning stiffness. Negative for joint pain, gait problem, joint pain, joint swelling, myalgias, muscle weakness, muscle tenderness and myalgias.  Skin:  Positive for rash. Negative for color change, hair loss and sensitivity to sunlight.  Allergic/Immunologic: Negative for susceptible to infections.  Neurological:  Positive for headaches. Negative for dizziness.  Hematological:  Negative for swollen glands.  Psychiatric/Behavioral:  Negative for depressed mood and sleep disturbance. The patient is nervous/anxious.     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 No pertinent surgical history. Social History[1] Social History   Social History Narrative   Lives with family     Immunization History  Administered Date(s) Administered   Influenza Inj Mdck Quad With Preservative 01/23/2018   PFIZER(Purple Top)SARS-COV-2 Vaccination 06/23/2019,  07/14/2019   Tdap 01/23/2018     Objective: Vital Signs: BP 125/87   Pulse 71   Temp 98.1 F (36.7 C)   Resp 14   Ht 5' 11 (1.803 m)   Wt 270 lb 12.8 oz (122.8 kg)   BMI 37.77 kg/m    Physical Exam Vitals and nursing note reviewed.  Constitutional:      Appearance: He is well-developed.  HENT:     Head: Normocephalic and atraumatic.  Eyes:     Conjunctiva/sclera: Conjunctivae normal.     Pupils: Pupils are equal, round, and  reactive to light.  Cardiovascular:     Rate and Rhythm: Normal rate and regular rhythm.     Heart sounds: Normal heart sounds.  Pulmonary:     Effort: Pulmonary effort is normal.     Breath sounds: Normal breath sounds.  Abdominal:     General: Bowel sounds are normal.     Palpations: Abdomen is soft.  Musculoskeletal:     Cervical back: Normal range of motion and neck supple.  Skin:    General: Skin is warm and dry.     Capillary Refill: Capillary refill takes less than 2 seconds.     Comments: Fingernail pitting noted Psoriasis noted on extensor surface of both elbows, umbilical region, and gluteal crease.  Neurological:     Mental Status: He is alert and oriented to person, place, and time.  Psychiatric:        Behavior: Behavior normal.      Musculoskeletal Exam: C-spine, thoracic spine, lumbar spine have good range of motion.  No midline spinal tenderness.  No SI joint tenderness.  Shoulder joints, elbow joints, wrist joints, MCPs, PIPs, DIPs have good range of motion with no synovitis.  Complete fist formation bilaterally.  Hip joints have good range of motion with no groin pain.  Knee joints have good range of motion no warmth or effusion.  Ankle joints have good range of motion no tenderness or joint swelling.  No evidence of Achilles tendinitis or plantar fasciitis.   CDAI Exam: CDAI Score: -- Patient Global: --; Provider Global: -- Swollen: --; Tender: -- Joint Exam 02/16/2024   No joint exam has been documented for this visit   There is currently no information documented on the homunculus. Go to the Rheumatology activity and complete the homunculus joint exam.  Investigation: No additional findings.  Imaging: No results found.  Recent Labs: Lab Results  Component Value Date   WBC 5.6 09/10/2023   HGB 15.2 09/10/2023   PLT 182 09/10/2023   NA 138 09/10/2023   K 4.6 09/10/2023   CL 101 09/10/2023   CO2 32 09/10/2023   GLUCOSE 88 09/10/2023   BUN 12  09/10/2023   CREATININE 0.91 09/10/2023   BILITOT 0.5 09/10/2023   ALKPHOS 46 05/07/2016   AST 20 09/10/2023   ALT 21 09/10/2023   PROT 7.3 09/10/2023   ALBUMIN 4.2 05/07/2016   CALCIUM 9.1 09/10/2023   GFRAA 146 04/21/2020   QFTBGOLDPLUS NEGATIVE 04/10/2023    Speciality Comments: Cosentyx  started on August 06, 2018 Inadequate response to Enbrel   Procedures:  No procedures performed Allergies: Nickel, Ibuprofen, and Penicillins   Assessment / Plan:     Visit Diagnoses: Psoriatic arthritis (HCC): He has no synovitis or dactylitis on examination today.  No SI joint tenderness upon palpation.  No evidence of Achilles tendinitis or plantar fasciitis.  He was prescribed Cosentyx  300 mg Supples injections once every 28 days.  He  is tolerating Cosentyx  without any side effects or injection site reactions.  He had to postpone his recent dose of Cosentyx  by 2 weeks due to recovering from an infection.  He administered Cosentyx  yesterday.  Patient has fingernail pitting as well as psoriasis on extensor surface of both elbows, gluteal crease, and umbilical region.  A refill of clobetasol  cream will be sent to the pharmacy today.  Patient will be undergoing a sleep study for evaluation of sleep apnea.  There is also been discussion of initiating a GLP-1 for weight loss.  He has been going to the gym and has been making dietary changes. He will remain on Cosentyx  as prescribed. He will follow up in 5 months or sooner if needed.    Psoriasis Fingernail pitting noted. Psoriasis on the extensor surface of both elbows-mild.  Gluteal crease and umbilical region.   A refill of clobetasol  cream will be sent to the pharmacy.  He will remain on cosentyx  as prescribed.    Idiopathic chronic gout of right ankle without tophus -He has not had any signs or symptoms of a flare.  He is clinically doing well taking allopurinol  300 mg daily and colchicine  0.6 mg 1 tablet daily as needed during gout flares. Uric acid  WNL-4.9 on 04/10/23 and 5.4 on 09/10/23.  Plan to recheck uric acid level today.   No medication changes will be made at this time.   - Plan: Uric acid  High risk medication use - Cosentyx  300 mg sq injection every 28 days (started on 08/06/18).  CBC and CMP within normal limits on 09/10/2023.  Orders for CBC and CMP were released today. TB Gold negative on 04/10/23.  Future order for TB gold placed today. Discussed the importance of holding cosentyx  if he develops signs or symptoms of an infection and to resume once the infection has completely cleared.  Patient receives annual flu shot through his PCPs office. - Plan: CBC with Differential/Platelet, Comprehensive metabolic panel with GFR, QuantiFERON-TB Gold Plus  Screening for tuberculosis - Future order for TB gold placed today.  Plan: QuantiFERON-TB Gold Plus  Other medical conditions are listed as follows:   Chronic left shoulder pain - MRI of the left shoulder obtained on 10/05/2020.  MRI was consistent with labral tear. Good ROM with no discomfort.   Vitamin D  deficiency: Vitamin D  WNL on 04/21/20.   Trapezius muscle spasm: Not currently symptomatic.   Other fatigue: Chronic, stable.   Family history of diabetes mellitus  Neuralgia: He is taking cymbalta 30 mg 1 capsule daily.    Orders: Orders Placed This Encounter  Procedures   CBC with Differential/Platelet   Comprehensive metabolic panel with GFR   Uric acid   QuantiFERON-TB Gold Plus   Meds ordered this encounter  Medications   clobetasol  cream (TEMOVATE ) 0.05 %    Sig: Apply 1 Application topically 2 (two) times daily as needed for up to 14 days.    Dispense:  30 g    Refill:  1    Follow-Up Instructions: Return in about 5 months (around 07/16/2024) for Psoriatic arthritis.   Waddell CHRISTELLA Craze, PA-C  Note - This record has been created using Dragon software.  Chart creation errors have been sought, but may not always  have been located. Such creation errors do not  reflect on  the standard of medical care.     [1]  Social History Tobacco Use   Smoking status: Never    Passive exposure: Never   Smokeless  tobacco: Former    Types: Snuff  Vaping Use   Vaping status: Never Used  Substance Use Topics   Alcohol use: Not Currently   Drug use: No   "

## 2024-02-16 ENCOUNTER — Ambulatory Visit: Attending: Physician Assistant | Admitting: Physician Assistant

## 2024-02-16 ENCOUNTER — Encounter: Payer: Self-pay | Admitting: Physician Assistant

## 2024-02-16 VITALS — BP 125/87 | HR 71 | Temp 98.1°F | Resp 14 | Ht 71.0 in | Wt 270.8 lb

## 2024-02-16 DIAGNOSIS — L409 Psoriasis, unspecified: Secondary | ICD-10-CM | POA: Diagnosis not present

## 2024-02-16 DIAGNOSIS — M1A0710 Idiopathic chronic gout, right ankle and foot, without tophus (tophi): Secondary | ICD-10-CM | POA: Diagnosis not present

## 2024-02-16 DIAGNOSIS — E559 Vitamin D deficiency, unspecified: Secondary | ICD-10-CM

## 2024-02-16 DIAGNOSIS — Z833 Family history of diabetes mellitus: Secondary | ICD-10-CM | POA: Diagnosis not present

## 2024-02-16 DIAGNOSIS — L4050 Arthropathic psoriasis, unspecified: Secondary | ICD-10-CM | POA: Diagnosis not present

## 2024-02-16 DIAGNOSIS — R5383 Other fatigue: Secondary | ICD-10-CM | POA: Diagnosis not present

## 2024-02-16 DIAGNOSIS — Z79899 Other long term (current) drug therapy: Secondary | ICD-10-CM | POA: Diagnosis not present

## 2024-02-16 DIAGNOSIS — G8929 Other chronic pain: Secondary | ICD-10-CM

## 2024-02-16 DIAGNOSIS — Z111 Encounter for screening for respiratory tuberculosis: Secondary | ICD-10-CM

## 2024-02-16 DIAGNOSIS — M792 Neuralgia and neuritis, unspecified: Secondary | ICD-10-CM

## 2024-02-16 DIAGNOSIS — M25512 Pain in left shoulder: Secondary | ICD-10-CM | POA: Diagnosis not present

## 2024-02-16 DIAGNOSIS — S6992XD Unspecified injury of left wrist, hand and finger(s), subsequent encounter: Secondary | ICD-10-CM

## 2024-02-16 DIAGNOSIS — M62838 Other muscle spasm: Secondary | ICD-10-CM | POA: Diagnosis not present

## 2024-02-16 MED ORDER — CLOBETASOL PROPIONATE 0.05 % EX CREA: 1.0000 | TOPICAL_CREAM | Freq: Two times a day (BID) | CUTANEOUS | 1 refills | Status: AC | PRN

## 2024-02-16 NOTE — Patient Instructions (Signed)
 Standing Labs We placed an order today for your standing lab work.   Please have your standing labs drawn in Mid-March and every 3 months    Please have your labs drawn 2 weeks prior to your appointment so that the provider can discuss your lab results at your appointment, if possible.  Please note that you may see your imaging and lab results in MyChart before we have reviewed them. We will contact you once all results are reviewed. Please allow our office up to 72 hours to thoroughly review all of the results before contacting the office for clarification of your results.  WALK-IN LAB HOURS  Monday through Thursday from 8:00 am - 4:30 pm and Friday from 8:00 am-12:00 pm.  Patients with office visits requiring labs will be seen before walk-in labs.  You may encounter longer than normal wait times. Please allow additional time. Wait times may be shorter on  Monday and Thursday afternoons.  We do not book appointments for walk-in labs. We appreciate your patience and understanding with our staff.   Labs are drawn by [facility]. Please bring your co-pay at the time of your lab draw.  You may receive a bill from [facility] for your lab work.  Please note if you are on Hydroxychloroquine and and an order has been placed for a Hydroxychloroquine level,  you will need to have it drawn 4 hours or more after your last dose.  If you wish to have your labs drawn at another location, please call the office 24 hours in advance so we can fax the orders.  The office is located at [address], [address], [place], [place] [address]   If you have any questions regarding directions or hours of operation,  please call [phone].   As a reminder, please drink plenty of water prior to coming for your lab work. Thanks!

## 2024-02-17 ENCOUNTER — Ambulatory Visit: Payer: Self-pay | Admitting: Physician Assistant

## 2024-02-17 LAB — CBC WITH DIFFERENTIAL/PLATELET
Absolute Lymphocytes: 1755 {cells}/uL (ref 850–3900)
Absolute Monocytes: 400 {cells}/uL (ref 200–950)
Basophils Absolute: 59 {cells}/uL (ref 0–200)
Basophils Relative: 1.1 %
Eosinophils Absolute: 81 {cells}/uL (ref 15–500)
Eosinophils Relative: 1.5 %
HCT: 45 % (ref 39.4–51.1)
Hemoglobin: 15.1 g/dL (ref 13.2–17.1)
MCH: 30.8 pg (ref 27.0–33.0)
MCHC: 33.6 g/dL (ref 31.6–35.4)
MCV: 91.6 fL (ref 81.4–101.7)
MPV: 10.6 fL (ref 7.5–12.5)
Monocytes Relative: 7.4 %
Neutro Abs: 3105 {cells}/uL (ref 1500–7800)
Neutrophils Relative %: 57.5 %
Platelets: 209 Thousand/uL (ref 140–400)
RBC: 4.91 Million/uL (ref 4.20–5.80)
RDW: 12.7 % (ref 11.0–15.0)
Total Lymphocyte: 32.5 %
WBC: 5.4 Thousand/uL (ref 3.8–10.8)

## 2024-02-17 LAB — COMPREHENSIVE METABOLIC PANEL WITH GFR
AG Ratio: 1.5 (calc) (ref 1.0–2.5)
ALT: 17 U/L (ref 9–46)
AST: 20 U/L (ref 10–40)
Albumin: 4.6 g/dL (ref 3.6–5.1)
Alkaline phosphatase (APISO): 70 U/L (ref 36–130)
BUN: 13 mg/dL (ref 7–25)
CO2: 22 mmol/L (ref 20–32)
Calcium: 9.4 mg/dL (ref 8.6–10.3)
Chloride: 101 mmol/L (ref 98–110)
Creat: 0.81 mg/dL (ref 0.60–1.29)
Globulin: 3 g/dL (ref 1.9–3.7)
Glucose, Bld: 90 mg/dL (ref 65–99)
Potassium: 4.4 mmol/L (ref 3.5–5.3)
Sodium: 138 mmol/L (ref 135–146)
Total Bilirubin: 0.6 mg/dL (ref 0.2–1.2)
Total Protein: 7.6 g/dL (ref 6.1–8.1)
eGFR: 114 mL/min/1.73m2

## 2024-02-17 LAB — URIC ACID: Uric Acid, Serum: 4.7 mg/dL (ref 4.0–8.0)

## 2024-02-17 NOTE — Progress Notes (Signed)
CBC and CMP WNL.  Uric acid WNL.

## 2024-08-04 ENCOUNTER — Ambulatory Visit: Admitting: Rheumatology
